# Patient Record
Sex: Male | Born: 1975 | Race: White | Hispanic: No | Smoking: Current every day smoker
Health system: Southern US, Community
[De-identification: ages and names within clinical notes are randomized; demographics above are authoritative.]

## PROBLEM LIST (undated history)

## (undated) DIAGNOSIS — R51 Headache: Secondary | ICD-10-CM

## (undated) DIAGNOSIS — K5900 Constipation, unspecified: Secondary | ICD-10-CM

## (undated) DIAGNOSIS — R519 Headache, unspecified: Secondary | ICD-10-CM

## (undated) DIAGNOSIS — F419 Anxiety disorder, unspecified: Secondary | ICD-10-CM

## (undated) DIAGNOSIS — M1990 Unspecified osteoarthritis, unspecified site: Secondary | ICD-10-CM

## (undated) DIAGNOSIS — K219 Gastro-esophageal reflux disease without esophagitis: Secondary | ICD-10-CM

## (undated) HISTORY — PX: BACK SURGERY: SHX140

## (undated) HISTORY — PX: CHOLECYSTECTOMY: SHX55

## (undated) HISTORY — PX: APPENDECTOMY: SHX54

---

## 1998-12-05 ENCOUNTER — Emergency Department (HOSPITAL_COMMUNITY): Admission: EM | Admit: 1998-12-05 | Discharge: 1998-12-05 | Payer: Self-pay | Admitting: Emergency Medicine

## 1998-12-05 ENCOUNTER — Encounter: Payer: Self-pay | Admitting: Emergency Medicine

## 2004-08-21 ENCOUNTER — Emergency Department (HOSPITAL_COMMUNITY): Admission: EM | Admit: 2004-08-21 | Discharge: 2004-08-21 | Payer: Self-pay | Admitting: Emergency Medicine

## 2005-03-17 ENCOUNTER — Emergency Department (HOSPITAL_COMMUNITY): Admission: EM | Admit: 2005-03-17 | Discharge: 2005-03-17 | Payer: Self-pay | Admitting: Emergency Medicine

## 2005-08-28 ENCOUNTER — Inpatient Hospital Stay (HOSPITAL_COMMUNITY): Admission: EM | Admit: 2005-08-28 | Discharge: 2005-08-29 | Payer: Self-pay | Admitting: Emergency Medicine

## 2005-08-28 ENCOUNTER — Encounter (INDEPENDENT_AMBULATORY_CARE_PROVIDER_SITE_OTHER): Payer: Self-pay | Admitting: *Deleted

## 2005-09-02 ENCOUNTER — Emergency Department (HOSPITAL_COMMUNITY): Admission: EM | Admit: 2005-09-02 | Discharge: 2005-09-02 | Payer: Self-pay | Admitting: Emergency Medicine

## 2006-04-11 ENCOUNTER — Emergency Department (HOSPITAL_COMMUNITY): Admission: EM | Admit: 2006-04-11 | Discharge: 2006-04-11 | Payer: Self-pay | Admitting: Emergency Medicine

## 2007-01-10 ENCOUNTER — Emergency Department (HOSPITAL_COMMUNITY): Admission: EM | Admit: 2007-01-10 | Discharge: 2007-01-10 | Payer: Self-pay | Admitting: Emergency Medicine

## 2007-09-19 ENCOUNTER — Emergency Department (HOSPITAL_COMMUNITY): Admission: EM | Admit: 2007-09-19 | Discharge: 2007-09-19 | Payer: Self-pay | Admitting: Emergency Medicine

## 2008-01-01 ENCOUNTER — Ambulatory Visit (HOSPITAL_COMMUNITY): Admission: RE | Admit: 2008-01-01 | Discharge: 2008-01-02 | Payer: Self-pay | Admitting: Neurosurgery

## 2008-08-02 ENCOUNTER — Emergency Department (HOSPITAL_COMMUNITY): Admission: EM | Admit: 2008-08-02 | Discharge: 2008-08-02 | Payer: Self-pay | Admitting: Emergency Medicine

## 2009-02-25 ENCOUNTER — Emergency Department (HOSPITAL_COMMUNITY): Admission: EM | Admit: 2009-02-25 | Discharge: 2009-02-25 | Payer: Self-pay | Admitting: Emergency Medicine

## 2009-05-05 ENCOUNTER — Emergency Department (HOSPITAL_COMMUNITY): Admission: EM | Admit: 2009-05-05 | Discharge: 2009-05-05 | Payer: Self-pay | Admitting: Emergency Medicine

## 2010-09-26 ENCOUNTER — Emergency Department (HOSPITAL_COMMUNITY)
Admission: EM | Admit: 2010-09-26 | Discharge: 2010-09-26 | Payer: Self-pay | Source: Home / Self Care | Admitting: Emergency Medicine

## 2011-02-14 NOTE — Op Note (Signed)
NAMEJUERGEN, Brett Stokes               ACCOUNT NO.:  [id number]   MEDICAL RECORD NO.:  [id number]          PATIENT TYPE:  OIB   LOCATION:  3534                         FACILITY:  [facility]   PHYSICIAN:  Coletta Memos, M.D.     DATE OF BIRTH:  1976/06/09   DATE OF PROCEDURE:  01/01/2008  DATE OF DISCHARGE:                               OPERATIVE REPORT   PREOPERATIVE DIAGNOSES:  1. Displaced disk, L4-5.  2. Displaced cyst, L5-S1, left.  3. Left L5 radiculopathy, left S1 radiculopathy.   POSTOPERATIVE DIAGNOSES:  1. Displaced disk, L4-5.  2. Displaced cyst, L5-S1, left.  3. Left L5 radiculopathy, left S1 radiculopathy.   PROCEDURE:  1. Left L5-S1 semi-hemilaminectomy and diskectomy with      microdissection.  2. Left L4-5 semi-hemilaminectomy and foraminotomy with      microdissection.   COMPLICATIONS:  None.   SURGEON:  Dr. Franky Macho.   ASSISTANT:  Dr. Danielle Dess.   INDICATIONS:  Mr. Cuccaro is a gentleman who presented with severe pain in  the left lower extremity, weakness in the left gastrocnemius, and on MRI  large herniated disk at L5-S1 centered to the left side, and a large  central disk at L4-5.  Both disks were degenerated.   Mr. Krumholz was taken to the operating room, intubated, placed under  general anesthetic without difficulty.  He is rolled prone onto a Wilson  frame, and all pressure points properly padded.  His back was prepped,  and he was draped in a sterile fashion.  I infiltrated 10 mL half  percent lidocaine 1:200,000 strength epinephrine at the beginning of the  case.  I opened the skin and took this down to the thoracolumbar fascia.  I exposed the lamina of L4-L5 and of S1.  I confirmed this with  intraoperative x-ray.  I then performed ascending hemilaminectomy of L4  and L5 using a high-speed drill and Kerrison punches.  I removed  ligamentum flavum and exposed the thecal sac between the L4 and L5  lamina.  I then performed a semi-hemilaminectomy at L5-S1.  I  brought  the microscope into the operative field and with microdissection  proceeded with my diskectomy at L5-S1.   With Dr. Verlee Rossetti assistance, I opened the disk space at L5-S1 and  removed disk material in a progressive fashion using pituitary rongeurs  and Epstein curettes.  I was able to fully decompress the left S1 nerve  root.  There was some disk left in the midline medial to the nerve root,  but at that point, I felt that I had done enough because the nerve root  was very well decompressed.   I then turned my attention back to the L4-5 level.  At that level, I  performed a foraminotomy.  However, Mr. Feagans is quite young, 35 years  of age, has had two marginally degenerated disks, and I did not feel  that removing the disk  at that level would be very helpful.  For that reason, I just performed  a foraminotomy with Dr. Verlee Rossetti assistance.  I then closed the wound in  layered fashion  using Vicryl sutures.  I used Dermabond to reapproximate  the skin edges.  Reapproximated the thoracolumbar fascia, subcutaneous  and subcuticular layers.           ______________________________  Coletta Memos, M.D.     KC/MEDQ  D:  01/01/2008  T:  01/02/2008  Job:  [id number]

## 2011-02-17 NOTE — Discharge Summary (Signed)
Brett Stokes, Brett Stokes               ACCOUNT NO.:  [id number]   MEDICAL RECORD NO.:  [id number]          PATIENT TYPE:  INP   LOCATION:  5707                         FACILITY:  [facility]   PHYSICIAN:  Vikki Ports, M.D.DATE OF BIRTH:  October 31, 1975   DATE OF ADMISSION:  08/27/2005  DATE OF DISCHARGE:  08/29/2005                                 DISCHARGE SUMMARY   DISCHARGE DIAGNOSES:  1.  Acute appendicitis, status post laparoscopic appendectomy on August 28, 2005.  2.  Polysubstance abuse.   [name] is a 35 year old male patient who has had approximately 12 hours  of right lower quadrant pain that has been waxing and waning.  The pain  persists and he presented to the emergency room, where white count was  10,000 and a CT showed only appendicitis.  The patient was taken emergently  to the operating room and underwent laparoscopic appendectomy by Dr.  Luan Pulling.  The patient tolerated the procedure well and was taken back to  his room in stable condition.  He was doing well the following morning and  was ready for discharge to home.  He was discharged to home in stable and  improved condition.   His discharge instructions included follow-up with Dr. Luan Pulling in three  weeks.  He was given a prescription for Vicodin as needed for pain, no  driving for one week, and he may shower on August 30, 2005.      [name], P.A.    ______________________________  Vikki Ports, M.D.    LB/MEDQ  D:  11/15/2005  T:  11/15/2005  Job:  [id number]

## 2011-02-17 NOTE — H&P (Signed)
NAMEMANASSEH, PITTSLEY               ACCOUNT NO.:  [id number]   MEDICAL RECORD NO.:  [id number]          PATIENT TYPE:  INP   LOCATION:  1824                         FACILITY:  [facility]   PHYSICIAN:  [name], P.A.       DATE OF BIRTH:  1975/10/10   DATE OF ADMISSION:  08/27/2005  DATE OF DISCHARGE:                                HISTORY & PHYSICAL   CHIEF COMPLAINT:  Right lower quadrant pain.   Brett Stokes presented to the emergency room last evening with less than 12  hours of right lower quadrant pain.  Pain was initially severe and then  became more like a waxing and waning pain.  Eventually the pain became so  severe he presented to the emergency room.  His white count is 10,000.  His  CT shows an early small appendolith with no definite appendicitis, though he  is definitely tender over McBurney's point.   ALLERGIES:  None.   MEDICATIONS:  None.   PAST MEDICAL HISTORY:  No significant past medical history.   SOCIAL HISTORY:  He does smoke.  He does drink at least a six-pack per week  and he used marijuana last night.   FAMILY HISTORY:  Mom is alive and well.  Dad is alive, but has had colon  cancer.   REVIEW OF SYSTEMS:  No chest pain.  No shortness of breath.  Review of  systems otherwise negative.   PHYSICAL EXAMINATION:  VITAL SIGNS:  Temperature 97.5, pulse 96,  respirations 19, blood pressure 125/63.  GENERAL:  He appears to be in mild distress.  HEENT:  Grossly normal.  No subclavian bruits.  Sclerae clear.  Conjunctivae  normal.  Nares without drainage.  CHEST:  Clear to auscultation bilaterally.  No wheezing or rhonchi.  ABDOMEN:  Right lower quadrant pin point pain and positive McBurney's.  EXTREMITIES:  No edema.  SKIN:  Warm and dry.   ASSESSMENT/PLAN:  Early appendicitis.  Discussed this plan further with Dr.  Luan Pulling and our plans are tentative OR later today.      [name], P.A.     LB/MEDQ  D:  08/28/2005  T:  08/28/2005  Job:  [id number]

## 2011-02-17 NOTE — Op Note (Signed)
[name], Brett Stokes               ACCOUNT NO.:  [id number]   MEDICAL RECORD NO.:  [id number]          PATIENT TYPE:  INP   LOCATION:  5707                         FACILITY:  [facility]   PHYSICIAN:  Vikki Ports, MDDATE OF BIRTH:  [birth year]   DATE OF PROCEDURE:  08/28/2005  DATE OF DISCHARGE:                                 OPERATIVE REPORT   PREOPERATIVE DIAGNOSIS:  Acute appendicitis.   POSTOPERATIVE DIAGNOSIS:  Acute appendicitis.   PROCEDURE:  Laparoscopic appendectomy.   SURGEON:  Vikki Ports, M.D.   ANESTHESIA:  General anesthesia.   DESCRIPTION OF PROCEDURE:  Patient was taken to the operating room and  placed in the supine position.  After adequate general anesthesia was used  using endotracheal tube, the abdomen was prepped and draped in normal  sterile fashion.  Using a left upper quadrant incision, a 12 mm Optiview  trocar was inserted into the abdomen.  Pneumoperitoneum was obtained.  Additional 12 mm and 5 mm trocars were placed in the left abdomen.  The  appendix was identified in the right lower quadrant.  Mesoappendix was taken  down using Harmonic scalpel.  There was some mild inflammation of the  appendix but no evidence of perforation and only mild inflammation. The base  of the appendix was then transected using a __________ GIA stapling device.  It was placed in an EndoCatch bag and removed through the 12 mm site.  Right  lower quadrant was then copiously irrigated.  The stapled edge was  inspected.  No evidence of bleeding was noted.  Pneumoperitoneum was  released.  Incisions were closed with subcuticular 4-0 Monocryl.  Steri-  Strips and sterile dressings were applied.  The patient tolerated the  procedure well and went to PACU in good condition.      Vikki Ports, MD  Electronically Signed     KRH/MEDQ  D:  08/28/2005  T:  08/28/2005  Job:  [id number]

## 2011-06-27 LAB — CBC
HCT: 44.1
Hemoglobin: 15.5
MCHC: 35.1
MCV: 94.4
Platelets: 172
RBC: 4.67
RDW: 14
WBC: 7.7

## 2011-07-07 LAB — RAPID STREP SCREEN (MED CTR MEBANE ONLY): Streptococcus, Group A Screen (Direct): POSITIVE — AB

## 2011-07-08 ENCOUNTER — Emergency Department (HOSPITAL_COMMUNITY): Payer: Self-pay

## 2011-07-08 ENCOUNTER — Emergency Department (HOSPITAL_COMMUNITY)
Admission: EM | Admit: 2011-07-08 | Discharge: 2011-07-08 | Disposition: A | Discharge status: Home / Self Care | Payer: Self-pay | Attending: Emergency Medicine | Admitting: Emergency Medicine

## 2011-07-08 DIAGNOSIS — S139XXA Sprain of joints and ligaments of unspecified parts of neck, initial encounter: Secondary | ICD-10-CM | POA: Insufficient documentation

## 2011-07-08 DIAGNOSIS — S0180XA Unspecified open wound of other part of head, initial encounter: Secondary | ICD-10-CM | POA: Insufficient documentation

## 2011-07-08 DIAGNOSIS — S0083XA Contusion of other part of head, initial encounter: Secondary | ICD-10-CM | POA: Insufficient documentation

## 2011-07-08 DIAGNOSIS — S0003XA Contusion of scalp, initial encounter: Secondary | ICD-10-CM | POA: Insufficient documentation

## 2011-07-08 DIAGNOSIS — Y9229 Other specified public building as the place of occurrence of the external cause: Secondary | ICD-10-CM | POA: Insufficient documentation

## 2011-07-08 DIAGNOSIS — F1010 Alcohol abuse, uncomplicated: Secondary | ICD-10-CM | POA: Insufficient documentation

## 2011-07-08 DIAGNOSIS — M542 Cervicalgia: Secondary | ICD-10-CM | POA: Insufficient documentation

## 2011-07-08 DIAGNOSIS — S0990XA Unspecified injury of head, initial encounter: Secondary | ICD-10-CM | POA: Insufficient documentation

## 2011-07-08 LAB — CBC
MCH: 33.8 pg (ref 26.0–34.0)
MCHC: 36.3 g/dL — ABNORMAL HIGH (ref 30.0–36.0)
MCV: 93.1 fL (ref 78.0–100.0)
Platelets: 218 10*3/uL (ref 150–400)

## 2011-07-08 LAB — BASIC METABOLIC PANEL
CO2: 24 mEq/L (ref 19–32)
Calcium: 8.8 mg/dL (ref 8.4–10.5)
GFR calc Af Amer: >90 mL/min (ref >90)

## 2011-07-08 LAB — ETHANOL: Alcohol, Ethyl (B): 180 mg/dL — ABNORMAL HIGH (ref 0–11)

## 2011-10-05 ENCOUNTER — Emergency Department (HOSPITAL_COMMUNITY)
Admission: EM | Admit: 2011-10-05 | Discharge: 2011-10-05 | Disposition: A | Discharge status: Home / Self Care | Payer: Self-pay | Attending: Emergency Medicine | Admitting: Emergency Medicine

## 2011-10-05 ENCOUNTER — Emergency Department (HOSPITAL_COMMUNITY): Payer: Self-pay

## 2011-10-05 DIAGNOSIS — M5430 Sciatica, unspecified side: Secondary | ICD-10-CM | POA: Insufficient documentation

## 2011-10-05 DIAGNOSIS — M79609 Pain in unspecified limb: Secondary | ICD-10-CM | POA: Insufficient documentation

## 2011-10-05 DIAGNOSIS — M5380 Other specified dorsopathies, site unspecified: Secondary | ICD-10-CM | POA: Insufficient documentation

## 2011-10-05 DIAGNOSIS — M5412 Radiculopathy, cervical region: Secondary | ICD-10-CM | POA: Insufficient documentation

## 2011-10-05 DIAGNOSIS — M5432 Sciatica, left side: Secondary | ICD-10-CM

## 2011-10-05 DIAGNOSIS — M545 Low back pain, unspecified: Secondary | ICD-10-CM | POA: Insufficient documentation

## 2011-10-05 DIAGNOSIS — M542 Cervicalgia: Secondary | ICD-10-CM | POA: Insufficient documentation

## 2011-10-05 DIAGNOSIS — R209 Unspecified disturbances of skin sensation: Secondary | ICD-10-CM | POA: Insufficient documentation

## 2011-10-05 MED ORDER — METHOCARBAMOL 500 MG PO TABS: 500.0000 mg | ORAL_TABLET | Freq: Two times a day (BID) | ORAL | Status: AC

## 2011-10-05 MED ORDER — METHOCARBAMOL 500 MG PO TABS
500.0000 mg | ORAL_TABLET | ORAL | Status: AC
  Administered 2011-10-05: 500 mg via ORAL
  Filled 2011-10-05: qty 1

## 2011-10-05 MED ORDER — DEXAMETHASONE SODIUM PHOSPHATE 10 MG/ML IJ SOLN
10.0000 mg | Freq: Once | INTRAMUSCULAR | Status: AC
  Administered 2011-10-05: 10 mg via INTRAMUSCULAR
  Filled 2011-10-05: qty 1

## 2011-10-05 MED ORDER — NAPROXEN 500 MG PO TABS: 500.0000 mg | ORAL_TABLET | Freq: Two times a day (BID) | ORAL | Status: AC

## 2011-10-05 MED ORDER — HYDROCODONE-ACETAMINOPHEN 5-325 MG PO TABS: 1.0000 | ORAL_TABLET | Freq: Four times a day (QID) | ORAL | Status: AC | PRN

## 2011-10-05 MED ORDER — KETOROLAC TROMETHAMINE 60 MG/2ML IM SOLN
60.0000 mg | Freq: Once | INTRAMUSCULAR | Status: AC
  Administered 2011-10-05: 60 mg via INTRAMUSCULAR
  Filled 2011-10-05: qty 2

## 2011-10-05 NOTE — ED Provider Notes (Deleted)
I saw and evaluated the patient, reviewed the resident's note and I agree with the findings and plan.  Patient does not appear to be any acute distress. She states she has fibromyalgia and this has not had urinary for her. Patient states her anxiety has been giving her trouble and her primary reason for coming to the ED he was to get some assistance with that. At this time there does not appear to be any evidence of acute emergency medical condition. Patient be discharged home and given referrals.  Celene Kras, MD 10/05/11 1009

## 2011-10-05 NOTE — ED Provider Notes (Signed)
Medical screening examination/treatment/procedure(s) were performed by non-physician practitioner and as supervising physician I was immediately available for consultation/collaboration.   Glynn Octave, MD 10/05/11 [phone]
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
## 2011-10-05 NOTE — ED Notes (Signed)
Lower back pain and posterior neck pain since October. Denies any injuries.  Pt. Has been treating the pain with [facility] and [facility]

## 2012-04-16 ENCOUNTER — Emergency Department (INDEPENDENT_AMBULATORY_CARE_PROVIDER_SITE_OTHER)
Admission: EM | Admit: 2012-04-16 | Discharge: 2012-04-16 | Disposition: A | Discharge status: Home / Self Care | Payer: Self-pay | Source: Home / Self Care | Attending: Emergency Medicine | Admitting: Emergency Medicine

## 2012-04-16 ENCOUNTER — Encounter (HOSPITAL_COMMUNITY): Payer: Self-pay

## 2012-04-16 DIAGNOSIS — G43909 Migraine, unspecified, not intractable, without status migrainosus: Secondary | ICD-10-CM

## 2012-04-16 MED ORDER — PREDNISONE 10 MG PO TABS: ORAL_TABLET | ORAL | Status: DC

## 2012-04-16 MED ORDER — PROPRANOLOL HCL ER 60 MG PO CP24: 60.0000 mg | ORAL_CAPSULE | Freq: Every day | ORAL | Status: DC

## 2012-04-16 MED ORDER — DIPHENHYDRAMINE HCL 50 MG/ML IJ SOLN
INTRAMUSCULAR | Status: AC
  Filled 2012-04-16: qty 1

## 2012-04-16 MED ORDER — ZOLMITRIPTAN 5 MG PO TABS: 5.0000 mg | ORAL_TABLET | ORAL | Status: DC | PRN

## 2012-04-16 MED ORDER — DIPHENHYDRAMINE HCL 50 MG/ML IJ SOLN
25.0000 mg | Freq: Once | INTRAMUSCULAR | Status: AC
  Administered 2012-04-16: 25 mg via INTRAMUSCULAR

## 2012-04-16 MED ORDER — KETOROLAC TROMETHAMINE 60 MG/2ML IM SOLN
INTRAMUSCULAR | Status: AC
  Filled 2012-04-16: qty 2

## 2012-04-16 MED ORDER — DEXAMETHASONE SODIUM PHOSPHATE 10 MG/ML IJ SOLN
INTRAMUSCULAR | Status: AC
  Filled 2012-04-16: qty 1

## 2012-04-16 MED ORDER — KETOROLAC TROMETHAMINE 60 MG/2ML IM SOLN
60.0000 mg | Freq: Once | INTRAMUSCULAR | Status: AC
Start: 2012-04-16 — End: 2012-04-16
  Administered 2012-04-16: 60 mg via INTRAMUSCULAR

## 2012-04-16 MED ORDER — DEXAMETHASONE SODIUM PHOSPHATE 10 MG/ML IJ SOLN
10.0000 mg | Freq: Once | INTRAMUSCULAR | Status: AC
  Administered 2012-04-16: 10 mg via INTRAMUSCULAR

## 2012-04-16 NOTE — ED Provider Notes (Signed)
Chief Complaint  Patient presents with  . Headache    History of Present Illness:   Brett Stokes is a 36 year old male who has had a many year history of migraine headaches. They seem to have gotten worse over the past 2 months and is not sure why. For the past week they've been occurring almost every night. He tried Topamax 50 mg 3 times a day but this didn't seem to help for prevention and tried sumatriptan but this didn't help for treatment. He's also tried ibuprofen but this hasn't helped much. He describes a left-sided, severe, throbbing headache associated with nausea, vomiting, photophobia, and phonophobia. Activity makes the pain worse and rest makes it better. He also has noted some associated vertigo and a feeling of being off balance when he walks. He denies any visual symptoms, numbness, tingling, or focal muscular weakness. He does not know of any specific migraine triggers. He's never been to see a physician about the migraines before, never had any kind of a headache workup, and never taken any prescription medication.  Review of Systems:  Other than noted above, the patient denies any of the following symptoms: Systemic:  No fever, chills, fatigue, photophobia, stiff neck. Eye:  No redness, eye pain, discharge, blurred vision, or diplopia. ENT:  No nasal congestion, rhinorrhea, sinus pressure or pain, sneezing, earache, or sore throat.  No jaw claudication. Neuro:  No paresthesias, loss of consciousness, seizure activity, muscle weakness, trouble with coordination or gait, trouble speaking or swallowing. Psych:  No depression, anxiety or trouble sleeping.  PMFSH:  Past medical history, family history, social history, meds, and allergies were reviewed.  Physical Exam:   Vital signs:  BP 113/74  Pulse 56  Temp 98 F (36.7 C) (Oral)  Resp 18  SpO2 100% General:  Alert and oriented.  In no distress. Eye:  Lids and conjunctivas normal.  PERRL,  Full EOMs.  Fundi benign with normal discs  and vessels. ENT:  TMs and canals clear.  Nasal mucosa was normal and uncongested without any drainage. No intra oral lesions, pharynx clear, mucous membranes moist, dentition normal. He has pain to palpation over the entire left hemicranium, extending down to the neck. Neck:  Supple, full ROM, no tenderness to palpation.  No adenopathy or mass. Neuro:  Alert and orented times 3.  Speech was clear, fluent, and appropriate.  Cranial nerves intact. No pronator drift, muscle strength normal. Finger to nose normal.  DTRs were 2+ and symmetrical.Station and gait were normal.  Romberg's sign was normal.  Able to perform tandem gait well. Psych:  Normal affect.  Medications given in [facility]:  He was given dexamethasone 10 mg IM, Toradol 60 mg IM, and Benadryl 25 mg IM and tolerated these all well without any immediate side effects.  Assessment:  The encounter diagnosis was Migraine headache.  Plan:   1.  The following meds were prescribed:   New Prescriptions   PREDNISONE (DELTASONE) 10 MG TABLET    Take 4 tabs daily for 4 days, 3 tabs daily for 4 days, 2 tabs daily for 4 days, then 1 tab daily for 4 days.   PROPRANOLOL ER (INDERAL LA) 60 MG 24 HR CAPSULE    Take 1 capsule (60 mg total) by mouth daily.   ZOLMITRIPTAN (ZOMIG) 5 MG TABLET    Take 1 tablet (5 mg total) by mouth as needed for migraine.   2.  The patient was instructed in symptomatic care and handouts were given. 3.  The patient  was told to return if becoming worse in any way, if no better in 3 or 4 days, and given some red flag symptoms that would indicate earlier return.  Follow up:  The patient was told to follow up with Dr. Karenann Cai in one to 2 weeks.     Reuben Likes, MD 04/16/12 [phone]
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
## 2014-06-14 ENCOUNTER — Emergency Department (HOSPITAL_COMMUNITY): Payer: Self-pay

## 2014-06-14 ENCOUNTER — Encounter (HOSPITAL_COMMUNITY): Payer: Self-pay | Admitting: Emergency Medicine

## 2014-06-14 ENCOUNTER — Emergency Department (HOSPITAL_COMMUNITY)
Admission: EM | Admit: 2014-06-14 | Discharge: 2014-06-14 | Disposition: A | Discharge status: Home / Self Care | Payer: Self-pay | Attending: Emergency Medicine | Admitting: Emergency Medicine

## 2014-06-14 DIAGNOSIS — S4980XA Other specified injuries of shoulder and upper arm, unspecified arm, initial encounter: Secondary | ICD-10-CM | POA: Insufficient documentation

## 2014-06-14 DIAGNOSIS — Z79899 Other long term (current) drug therapy: Secondary | ICD-10-CM | POA: Insufficient documentation

## 2014-06-14 DIAGNOSIS — S46909A Unspecified injury of unspecified muscle, fascia and tendon at shoulder and upper arm level, unspecified arm, initial encounter: Secondary | ICD-10-CM | POA: Insufficient documentation

## 2014-06-14 DIAGNOSIS — Y9229 Other specified public building as the place of occurrence of the external cause: Secondary | ICD-10-CM | POA: Insufficient documentation

## 2014-06-14 DIAGNOSIS — M542 Cervicalgia: Secondary | ICD-10-CM

## 2014-06-14 DIAGNOSIS — F17200 Nicotine dependence, unspecified, uncomplicated: Secondary | ICD-10-CM | POA: Insufficient documentation

## 2014-06-14 DIAGNOSIS — S40011A Contusion of right shoulder, initial encounter: Secondary | ICD-10-CM

## 2014-06-14 DIAGNOSIS — S40019A Contusion of unspecified shoulder, initial encounter: Secondary | ICD-10-CM | POA: Insufficient documentation

## 2014-06-14 DIAGNOSIS — S199XXA Unspecified injury of neck, initial encounter: Secondary | ICD-10-CM

## 2014-06-14 DIAGNOSIS — S0993XA Unspecified injury of face, initial encounter: Secondary | ICD-10-CM | POA: Insufficient documentation

## 2014-06-14 DIAGNOSIS — W010XXA Fall on same level from slipping, tripping and stumbling without subsequent striking against object, initial encounter: Secondary | ICD-10-CM | POA: Insufficient documentation

## 2014-06-14 DIAGNOSIS — Y9389 Activity, other specified: Secondary | ICD-10-CM | POA: Insufficient documentation

## 2014-06-14 MED ORDER — HYDROCODONE-ACETAMINOPHEN 5-325 MG PO TABS
2.0000 | ORAL_TABLET | Freq: Once | ORAL | Status: AC
  Administered 2014-06-14: 2 via ORAL
  Filled 2014-06-14: qty 2

## 2014-06-14 MED ORDER — HYDROCODONE-ACETAMINOPHEN 5-325 MG PO TABS
1.0000 | ORAL_TABLET | Freq: Four times a day (QID) | ORAL | Status: DC | PRN
Start: 2014-06-14 — End: 2015-01-01

## 2014-06-14 MED ORDER — IBUPROFEN 600 MG PO TABS: 600.0000 mg | ORAL_TABLET | Freq: Three times a day (TID) | ORAL | Status: DC | PRN

## 2014-06-14 NOTE — ED Provider Notes (Addendum)
CSN: [id number]     Arrival date & time 06/14/14  7829 History   First MD Initiated Contact with Patient 06/14/14 0830     Chief Complaint  Patient presents with  . Shoulder Pain     (Consider location/radiation/quality/duration/timing/severity/associated sxs/prior Treatment) Patient is a 38 y.o. male presenting with shoulder pain. The history is provided by the patient.  Shoulder Pain Pertinent negatives include no chest pain, no abdominal pain, no headaches and no shortness of breath.  pt c/o slip and fall on wet floor at restaurant this morning. Landed on right side/shoulder. C/o right clavicle and shoulder pain, as well as neck pain. Pain constant, dull, mod-severe, non radiating. Worse w palpation and movement. No radicular pain. No extremity numbness/weakness. Denies head injury or loc. No headache. No back pain. No cp or sob. No abd pain or nv. No lower extremity pain or injury. Skin intact.      History reviewed. No pertinent past medical history. Past Surgical History  Procedure Laterality Date  . Back surgery    . Cholecystectomy    . Appendectomy     Family History  Problem Relation Age of Onset  . Cancer Father    History  Substance Use Topics  . Smoking status: Current Every Day Smoker  . Smokeless tobacco: Not on file  . Alcohol Use: Yes    Review of Systems  Constitutional: Negative for fever and chills.  HENT: Negative for sore throat.   Eyes: Negative for pain and visual disturbance.  Respiratory: Negative for shortness of breath.   Cardiovascular: Negative for chest pain.  Gastrointestinal: Negative for vomiting and abdominal pain.  Genitourinary: Negative for flank pain.  Musculoskeletal: Positive for neck pain. Negative for back pain.  Skin: Negative for rash.  Neurological: Negative for weakness, numbness and headaches.  Hematological: Does not bruise/bleed easily.  Psychiatric/Behavioral: Negative for confusion.      Allergies  Review of  patient's allergies indicates no known allergies.  Home Medications   Prior to Admission medications   Medication Sig Start Date End Date Taking? Authorizing Provider  clonazePAM (KLONOPIN) 1 MG tablet Take 1 mg by mouth once.   Yes Historical Provider, MD   BP 127/77  Pulse 84  Temp(Src) 97.9 F (36.6 C) (Oral)  Resp 18  SpO2 96% Physical Exam  Nursing note and vitals reviewed. Constitutional: He is oriented to person, place, and time. He appears well-developed and well-nourished. No distress.  HENT:  Head: Atraumatic.  Mouth/Throat: Oropharynx is clear and moist.  Eyes: Conjunctivae are normal. Pupils are equal, round, and reactive to light. No scleral icterus.  Neck: Normal range of motion. Neck supple. No tracheal deviation present.  Cardiovascular: Normal rate, regular rhythm, normal heart sounds and intact distal pulses.   Pulmonary/Chest: Effort normal and breath sounds normal. No accessory muscle usage. No respiratory distress. He exhibits no tenderness.  Abdominal: Soft. Bowel sounds are normal. He exhibits no distension. There is no tenderness.  Musculoskeletal: Normal range of motion.  Mid cervical tenderness, otherwise, CTLS spine, non tender, aligned, no step off. Tenderness right clavicle and right shoulder, otherwise, good rom bil ext without other pain or focal bony tenderness.   Neurological: He is alert and oriented to person, place, and time.  Motor intact bil. stre 5/5. sens intact.   Skin: Skin is warm and dry. He is not diaphoretic.  Psychiatric: He has a normal mood and affect.    ED Course  Procedures (including critical care time) Labs Review  Dg Cervical Spine Complete  06/14/2014   CLINICAL DATA:  Fall with neck pain.  EXAM: CERVICAL SPINE  4+ VIEWS  COMPARISON:  10/05/2011 radiographs  FINDINGS: Reversal of the normal cervical lordosis is noted.  There is no evidence of acute fracture, subluxation or prevertebral soft tissue swelling.  No focal bony  lesions are identified. The disc spaces are maintained.  No bony foraminal narrowing identified.  IMPRESSION: Reversal of the normal cervical lordosis without fracture, subluxation or prevertebral soft tissue swelling.   Electronically Signed   By: Laveda Abbe M.D.   On: 06/14/2014 09:06   Dg Clavicle Right  06/14/2014   CLINICAL DATA:  38 year old male with right clavicle pain following fall.  EXAM: RIGHT CLAVICLE - 2+ VIEWS  COMPARISON:  None.  FINDINGS: There is no evidence of fracture or other focal bone lesions. Soft tissues are unremarkable.  IMPRESSION: Negative.   Electronically Signed   By: Laveda Abbe M.D.   On: 06/14/2014 09:04   Dg Shoulder Right  06/14/2014   CLINICAL DATA:  Fall with right shoulder injury and pain.  EXAM: RIGHT SHOULDER - 2+ VIEW  COMPARISON:  None.  FINDINGS: There is no evidence of fracture or dislocation. There is no evidence of arthropathy or other focal bone abnormality. Soft tissues are unremarkable.  IMPRESSION: Negative.   Electronically Signed   By: Laveda Abbe M.D.   On: 06/14/2014 09:09     MDM   Xrays.  Reviewed nursing notes and prior charts for additional history.   Pt arrived via ems, no meds for pain pta.  vicodin po. Motrin po.   xrays neg for acute fx. Recheck spine, no focal midline bony tenderness. Pt appears comfortable, talking on cell phone.   When discussing xray results with patient and specifically that no acute fracture was found, that his pain likely reflects muscular sprain/strain, spasm, nerve impingement, or other soft tissue injury, and that we would be discharging home w rx for pain and pcp follow up, the pt immediately becomes inappropriate, agitated, verbally abusive, stating '[place] and  suck..this place is the most f'd up, terrible place.....Marland KitchenI'm going elsewhere, and abruptly leaves room/ED.      Suzi Roots, MD 06/14/14 [id number]  Suzi Roots, MD 06/14/14 [phone]

## 2014-06-14 NOTE — ED Notes (Signed)
Pt in Radiology 

## 2014-06-14 NOTE — ED Notes (Signed)
Per ems pt was at [employer], reports he slipped on water in bathroom. Fell onto right sided, landing on right shoulder. Right shoulder and right sided neck pain 10/10. No neuro deficits, no LOC, denies hitting head. Pt in c collar. Pain 10/10.

## 2014-06-14 NOTE — Discharge Instructions (Signed)
Take motrin as need for pain. You may also take hydrocodone as need for pain. No driving when taking hydrocodone. Also, do not take tylenol or acetaminophen containing medication when taking hydrocodone. Follow up with primary care doctor in 1 week if symptoms fail to improve/resolve. Return to ER if worse, new symptoms, numbness/weakness, intractable pain, other concern.  You were given pain medication in the ER - no driving for the next 4 hours.     Cervical Sprain A cervical sprain is an injury in the neck in which the strong, fibrous tissues (ligaments) that connect your neck bones stretch or tear. Cervical sprains can range from mild to severe. Severe cervical sprains can cause the neck vertebrae to be unstable. This can lead to damage of the spinal cord and can result in serious nervous system problems. The amount of time it takes for a cervical sprain to get better depends on the cause and extent of the injury. Most cervical sprains heal in 1 to 3 weeks. CAUSES  Severe cervical sprains may be caused by:   Contact sport injuries (such as from football, rugby, wrestling, hockey, auto racing, gymnastics, diving, martial arts, or boxing).   Motor vehicle collisions.   Whiplash injuries. This is an injury from a sudden forward and backward whipping movement of the head and neck.  Falls.  Mild cervical sprains may be caused by:   Being in an awkward position, such as while cradling a telephone between your ear and shoulder.   Sitting in a chair that does not offer proper support.   Working at a poorly Marketing executive station.   Looking up or down for long periods of time.  SYMPTOMS   Pain, soreness, stiffness, or a burning sensation in the front, back, or sides of the neck. This discomfort may develop immediately after the injury or slowly, 24 hours or more after the injury.   Pain or tenderness directly in the middle of the back of the neck.   Shoulder or upper back  pain.   Limited ability to move the neck.   Headache.   Dizziness.   Weakness, numbness, or tingling in the hands or arms.   Muscle spasms.   Difficulty swallowing or chewing.   Tenderness and swelling of the neck.  DIAGNOSIS  Most of the time your health care provider can diagnose a cervical sprain by taking your history and doing a physical exam. Your health care provider will ask about previous neck injuries and any known neck problems, such as arthritis in the neck. X-rays may be taken to find out if there are any other problems, such as with the bones of the neck. Other tests, such as a CT scan or MRI, may also be needed.  TREATMENT  Treatment depends on the severity of the cervical sprain. Mild sprains can be treated with rest, keeping the neck in place (immobilization), and pain medicines. Severe cervical sprains are immediately immobilized. Further treatment is done to help with pain, muscle spasms, and other symptoms and may include:  Medicines, such as pain relievers, numbing medicines, or muscle relaxants.   Physical therapy. This may involve stretching exercises, strengthening exercises, and posture training. Exercises and improved posture can help stabilize the neck, strengthen muscles, and help stop symptoms from returning.  HOME CARE INSTRUCTIONS   Put ice on the injured area.   Put ice in a plastic bag.   Place a towel between your skin and the bag.   Leave the ice on for  15-20 minutes, 3-4 times a day.   If your injury was severe, you may have been given a cervical collar to wear. A cervical collar is a two-piece collar designed to keep your neck from moving while it heals.  Do not remove the collar unless instructed by your health care provider.  If you have long hair, keep it outside of the collar.  Ask your health care provider before making any adjustments to your collar. Minor adjustments may be required over time to improve comfort and  reduce pressure on your chin or on the back of your head.  Ifyou are allowed to remove the collar for cleaning or bathing, follow your health care provider's instructions on how to do so safely.  Keep your collar clean by wiping it with mild soap and water and drying it completely. If the collar you have been given includes removable pads, remove them every 1-2 days and hand wash them with soap and water. Allow them to air dry. They should be completely dry before you wear them in the collar.  If you are allowed to remove the collar for cleaning and bathing, wash and dry the skin of your neck. Check your skin for irritation or sores. If you see any, tell your health care provider.  Do not drive while wearing the collar.   Only take over-the-counter or prescription medicines for pain, discomfort, or fever as directed by your health care provider.   Keep all follow-up appointments as directed by your health care provider.   Keep all physical therapy appointments as directed by your health care provider.   Make any needed adjustments to your workstation to promote good posture.   Avoid positions and activities that make your symptoms worse.   Warm up and stretch before being active to help prevent problems.  SEEK MEDICAL CARE IF:   Your pain is not controlled with medicine.   You are unable to decrease your pain medicine over time as planned.   Your activity level is not improving as expected.  SEEK IMMEDIATE MEDICAL CARE IF:   You develop any bleeding.  You develop stomach upset.  You have signs of an allergic reaction to your medicine.   Your symptoms get worse.   You develop new, unexplained symptoms.   You have numbness, tingling, weakness, or paralysis in any part of your body.  MAKE SURE YOU:   Understand these instructions.  Will watch your condition.  Will get help right away if you are not doing well or get worse. Document Released: 07/16/2007  Document Revised: 09/23/2013 Document Reviewed: 03/26/2013 [facility] Patient Information 2015 [place], [place]. This information is not intended to replace advice given to you by your health care provider. Make sure you discuss any questions you have with your health care provider.    Shoulder Pain The shoulder is the joint that connects your arms to your body. The bones that form the shoulder joint include the upper arm bone (humerus), the shoulder blade (scapula), and the collarbone (clavicle). The top of the humerus is shaped like a ball and fits into a rather flat socket on the scapula (glenoid cavity). A combination of muscles and strong, fibrous tissues that connect muscles to bones (tendons) support your shoulder joint and hold the ball in the socket. Small, fluid-filled sacs (bursae) are located in different areas of the joint. They act as cushions between the bones and the overlying soft tissues and help reduce friction between the gliding tendons and the bone  as you move your arm. Your shoulder joint allows a wide range of motion in your arm. This range of motion allows you to do things like scratch your back or throw a ball. However, this range of motion also makes your shoulder more prone to pain from overuse and injury. Causes of shoulder pain can originate from both injury and overuse and usually can be grouped in the following four categories:  Redness, swelling, and pain (inflammation) of the tendon (tendinitis) or the bursae (bursitis).  Instability, such as a dislocation of the joint.  Inflammation of the joint (arthritis).  Broken bone (fracture). HOME CARE INSTRUCTIONS   Apply ice to the sore area.  Put ice in a plastic bag.  Place a towel between your skin and the bag.  Leave the ice on for 15-20 minutes, 3-4 times per day for the first 2 days, or as directed by your health care provider.  Stop using cold packs if they do not help with the pain.  If you have a shoulder  sling or immobilizer, wear it as long as your caregiver instructs. Only remove it to shower or bathe. Move your arm as little as possible, but keep your hand moving to prevent swelling.  Squeeze a soft ball or foam pad as much as possible to help prevent swelling.  Only take over-the-counter or prescription medicines for pain, discomfort, or fever as directed by your caregiver. SEEK MEDICAL CARE IF:   Your shoulder pain increases, or new pain develops in your arm, hand, or fingers.  Your hand or fingers become cold and numb.  Your pain is not relieved with medicines. SEEK IMMEDIATE MEDICAL CARE IF:   Your arm, hand, or fingers are numb or tingling.  Your arm, hand, or fingers are significantly swollen or turn white or blue. MAKE SURE YOU:   Understand these instructions.  Will watch your condition.  Will get help right away if you are not doing well or get worse. Document Released: 06/28/2005 Document Revised: 02/02/2014 Document Reviewed: 09/02/2011 [facility] Patient Information 2015 [place], [place]. This information is not intended to replace advice given to you by your health care provider. Make sure you discuss any questions you have with your health care provider.

## 2014-06-14 NOTE — ED Notes (Signed)
Bed: MV78 Expected date: 06/14/14 Expected time: 8:08 AM Means of arrival: Ambulance Comments: Fall, shoulder injury

## 2014-06-14 NOTE — ED Notes (Signed)
Pt lets NT Verlon Au he is leaving and not waiting any more. Pt left facility. Pt ambulatory upon leaving. Pt left without dc papers

## 2014-06-15 ENCOUNTER — Encounter (HOSPITAL_COMMUNITY): Payer: Self-pay | Admitting: Emergency Medicine

## 2014-06-15 ENCOUNTER — Emergency Department (HOSPITAL_COMMUNITY)
Admission: EM | Admit: 2014-06-15 | Discharge: 2014-06-15 | Disposition: A | Discharge status: Home / Self Care | Payer: Self-pay | Attending: Emergency Medicine | Admitting: Emergency Medicine

## 2014-06-15 DIAGNOSIS — M25519 Pain in unspecified shoulder: Secondary | ICD-10-CM | POA: Insufficient documentation

## 2014-06-15 DIAGNOSIS — F17200 Nicotine dependence, unspecified, uncomplicated: Secondary | ICD-10-CM | POA: Insufficient documentation

## 2014-06-15 DIAGNOSIS — M542 Cervicalgia: Secondary | ICD-10-CM | POA: Insufficient documentation

## 2014-06-15 DIAGNOSIS — M25511 Pain in right shoulder: Secondary | ICD-10-CM

## 2014-06-15 DIAGNOSIS — W19XXXD Unspecified fall, subsequent encounter: Secondary | ICD-10-CM

## 2014-06-15 NOTE — Discharge Instructions (Signed)
Continue taking tramadol and ibuprofen for pain.  Musculoskeletal Pain Musculoskeletal pain is muscle and boney aches and pains. These pains can occur in any part of the body. Your caregiver may treat you without knowing the cause of the pain. They may treat you if blood or urine tests, X-rays, and other tests were normal.  CAUSES There is often not a definite cause or reason for these pains. These pains may be caused by a type of germ (virus). The discomfort may also come from overuse. Overuse includes working out too hard when your body is not fit. Boney aches also come from weather changes. Bone is sensitive to atmospheric pressure changes. HOME CARE INSTRUCTIONS   Ask when your test results will be ready. Make sure you get your test results.  Only take over-the-counter or prescription medicines for pain, discomfort, or fever as directed by your caregiver. If you were given medications for your condition, do not drive, operate machinery or power tools, or sign legal documents for 24 hours. Do not drink alcohol. Do not take sleeping pills or other medications that may interfere with treatment.  Continue all activities unless the activities cause more pain. When the pain lessens, slowly resume normal activities. Gradually increase the intensity and duration of the activities or exercise.  During periods of severe pain, bed rest may be helpful. Lay or sit in any position that is comfortable.  Putting ice on the injured area.  Put ice in a bag.  Place a towel between your skin and the bag.  Leave the ice on for 15 to 20 minutes, 3 to 4 times a day.  Follow up with your caregiver for continued problems and no reason can be found for the pain. If the pain becomes worse or does not go away, it may be necessary to repeat tests or do additional testing. Your caregiver may need to look further for a possible cause. SEEK IMMEDIATE MEDICAL CARE IF:  You have pain that is getting worse and is not  relieved by medications.  You develop chest pain that is associated with shortness or breath, sweating, feeling sick to your stomach (nauseous), or throw up (vomit).  Your pain becomes localized to the abdomen.  You develop any new symptoms that seem different or that concern you. MAKE SURE YOU:   Understand these instructions.  Will watch your condition.  Will get help right away if you are not doing well or get worse. Document Released: 09/18/2005 Document Revised: 12/11/2011 Document Reviewed: 05/23/2013 [facility] Patient Information 2015 [place], [place]. This information is not intended to replace advice given to you by your health care provider. Make sure you discuss any questions you have with your health care provider.

## 2014-06-15 NOTE — ED Notes (Signed)
Patient states he has bad shoulder pain and bad neck pain from fall at 0800 yesterday morning.  Patient states since last night took 6 Klonopin (roomates medication) and 15 tramadol to help him sleep.

## 2014-06-15 NOTE — ED Provider Notes (Signed)
CSN: [id number]     Arrival date & time 06/15/14  4098 History   First MD Initiated Contact with Patient 06/15/14 0840     Chief Complaint  Patient presents with  . Neck Pain  . Shoulder Pain     (Consider location/radiation/quality/duration/timing/severity/associated sxs/prior Treatment) HPI Comments: Pt is a 38 y/o male who presents to the ED complaining of continued constant right shoulder pain and neck pain from a fall that occurred around 8:00 AM 1 day ago. Pt was seen in the ED after the fall, had images of right shoulder, clavicle and c-spine without any acute findings. States the pain has continued and is "really bad, making me cry at night" rated 10/10. He took 11 tramadol, 6 Klonopin (his roommates that is in prison) and ibuprofen with no relief. Last took tramadol at 3:00 AM. Pain worse with any movement. Denies fever or chills. It is noted he walked out of the ED yesterday angrily, swearing about his treatment.  Patient is a 38 y.o. male presenting with neck pain and shoulder pain. The history is provided by the patient and medical records.  Neck Pain Shoulder Pain Associated symptoms include neck pain.    History reviewed. No pertinent past medical history. Past Surgical History  Procedure Laterality Date  . Back surgery    . Cholecystectomy    . Appendectomy     Family History  Problem Relation Age of Onset  . Cancer Father    History  Substance Use Topics  . Smoking status: Current Every Day Smoker -- 1.00 packs/day    Types: Cigarettes  . Smokeless tobacco: Not on file  . Alcohol Use: Yes    Review of Systems  Constitutional: Negative.   HENT: Negative.   Musculoskeletal: Positive for neck pain.       +R shoulder pain.  Skin: Negative.   Neurological: Negative.       Allergies  Review of patient's allergies indicates no known allergies.  Home Medications   Prior to Admission medications   Medication Sig Start Date End Date Taking? Authorizing  Provider  clonazePAM (KLONOPIN) 1 MG tablet Take 1 mg by mouth once.    Historical Provider, MD  HYDROcodone-acetaminophen (NORCO/VICODIN) 5-325 MG per tablet Take 1-2 tablets by mouth every 6 (six) hours as needed for moderate pain. 06/14/14   Suzi Roots, MD  ibuprofen (ADVIL,MOTRIN) 600 MG tablet Take 1 tablet (600 mg total) by mouth every 8 (eight) hours as needed. Take with food. 06/14/14   Suzi Roots, MD   BP 108/79  Pulse 71  Temp(Src) 97.7 F (36.5 C) (Oral)  Resp 18  Ht  (1.854 m)  Wt 215 lb (97.523 kg)  BMI 28.37 kg/m2  SpO2 99% Physical Exam  Nursing note and vitals reviewed. Constitutional: He is oriented to person, place, and time. He appears well-developed and well-nourished. No distress.  HENT:  Head: Normocephalic and atraumatic.  Eyes: Conjunctivae and EOM are normal.  Neck: Normal range of motion. Neck supple.  Cardiovascular: Normal rate, regular rhythm, normal heart sounds and intact distal pulses.   Pulmonary/Chest: Effort normal and breath sounds normal.  Musculoskeletal:  TTP throughout entire right shoulder girdle. No deformity, swelling, bruising or signs of trauma. ROM limited by pain. TTP right neck paraspinal muscles. No spinous process tenderness. ROM limited when asking to perform ROM, otherwise moving neck around when asking HPI.  Neurological: He is alert and oriented to person, place, and time.  Skin: Skin is warm and  dry.  Psychiatric: He has a normal mood and affect. His behavior is normal.    ED Course  Procedures (including critical care time) Labs Review Labs Reviewed - No data to display  Imaging Review Dg Cervical Spine Complete  06/14/2014   CLINICAL DATA:  Fall with neck pain.  EXAM: CERVICAL SPINE  4+ VIEWS  COMPARISON:  10/05/2011 radiographs  FINDINGS: Reversal of the normal cervical lordosis is noted.  There is no evidence of acute fracture, subluxation or prevertebral soft tissue swelling.  No focal bony lesions are  identified. The disc spaces are maintained.  No bony foraminal narrowing identified.  IMPRESSION: Reversal of the normal cervical lordosis without fracture, subluxation or prevertebral soft tissue swelling.   Electronically Signed   By: Laveda Abbe M.D.   On: 06/14/2014 09:06   Dg Clavicle Right  06/14/2014   CLINICAL DATA:  38 year old male with right clavicle pain following fall.  EXAM: RIGHT CLAVICLE - 2+ VIEWS  COMPARISON:  None.  FINDINGS: There is no evidence of fracture or other focal bone lesions. Soft tissues are unremarkable.  IMPRESSION: Negative.   Electronically Signed   By: Laveda Abbe M.D.   On: 06/14/2014 09:04   Dg Shoulder Right  06/14/2014   CLINICAL DATA:  Fall with right shoulder injury and pain.  EXAM: RIGHT SHOULDER - 2+ VIEW  COMPARISON:  None.  FINDINGS: There is no evidence of fracture or dislocation. There is no evidence of arthropathy or other focal bone abnormality. Soft tissues are unremarkable.  IMPRESSION: Negative.   Electronically Signed   By: Laveda Abbe M.D.   On: 06/14/2014 09:09     EKG Interpretation None      MDM   Final diagnoses:  Right shoulder pain  Fall, subsequent encounter   Pt presenting with continued pain after being seen 1 day ago for the same with negative imaging studies. Noted to have left ED angrily. He is well appearing and in NAD. AFVSS. Discussed f/u with ortho if no improvement. Sling given for comfort. Continue tramadol. Stable for d/c. Return precautions given. Patient unhappy with care plan, however I feel this is most appropriate. I do not feel narcotics are appropriate, especially given pt taking his roommates benzo and the high amount of tramadol.  Trevor Mace, PA-C 06/15/14 [phone]

## 2014-06-15 NOTE — ED Provider Notes (Signed)
Medical screening examination/treatment/procedure(s) were performed by non-physician practitioner and as supervising physician I was immediately available for consultation/collaboration.  Edwardo Wojnarowski, MD 06/15/14 1607 

## 2014-08-07 ENCOUNTER — Ambulatory Visit: Payer: Self-pay

## 2014-08-24 ENCOUNTER — Encounter: Payer: Self-pay | Admitting: Internal Medicine

## 2014-08-24 ENCOUNTER — Ambulatory Visit: Payer: No Typology Code available for payment source | Attending: Internal Medicine | Admitting: Internal Medicine

## 2014-08-24 VITALS — BP 117/82 | HR 81 | Temp 98.0°F | Resp 16 | Wt 215.8 lb

## 2014-08-24 DIAGNOSIS — Z23 Encounter for immunization: Secondary | ICD-10-CM | POA: Insufficient documentation

## 2014-08-24 DIAGNOSIS — Z833 Family history of diabetes mellitus: Secondary | ICD-10-CM | POA: Insufficient documentation

## 2014-08-24 DIAGNOSIS — F17200 Nicotine dependence, unspecified, uncomplicated: Secondary | ICD-10-CM

## 2014-08-24 DIAGNOSIS — Z79891 Long term (current) use of opiate analgesic: Secondary | ICD-10-CM | POA: Insufficient documentation

## 2014-08-24 DIAGNOSIS — M25511 Pain in right shoulder: Secondary | ICD-10-CM | POA: Insufficient documentation

## 2014-08-24 DIAGNOSIS — F17210 Nicotine dependence, cigarettes, uncomplicated: Secondary | ICD-10-CM | POA: Insufficient documentation

## 2014-08-24 DIAGNOSIS — Z139 Encounter for screening, unspecified: Secondary | ICD-10-CM

## 2014-08-24 DIAGNOSIS — Z720 Tobacco use: Secondary | ICD-10-CM

## 2014-08-24 LAB — COMPLETE METABOLIC PANEL WITH GFR
ALT: 11 U/L (ref 0–53)
AST: 18 U/L (ref 0–37)
Albumin: 4.5 g/dL (ref 3.5–5.2)
Alkaline Phosphatase: 54 U/L (ref 39–117)
BILIRUBIN TOTAL: 0.5 mg/dL (ref 0.2–1.2)
BUN: 8 mg/dL (ref 6–23)
CALCIUM: 9.7 mg/dL (ref 8.4–10.5)
CHLORIDE: 107 meq/L (ref 96–112)
CO2: 22 mEq/L (ref 19–32)
CREATININE: 0.93 mg/dL (ref 0.50–1.35)
GFR, Est African American: >89 mL/min
GFR, Est Non African American: >89 mL/min
Glucose, Bld: 106 mg/dL — ABNORMAL HIGH (ref 70–99)
Potassium: 3.8 mEq/L (ref 3.5–5.3)
Sodium: 143 mEq/L (ref 135–145)
Total Protein: 6.4 g/dL (ref 6.0–8.3)

## 2014-08-24 LAB — HEMOGLOBIN A1C
HEMOGLOBIN A1C: 5.2 % (ref <5.7)
MEAN PLASMA GLUCOSE: 103 mg/dL (ref <117)

## 2014-08-24 LAB — CBC WITH DIFFERENTIAL/PLATELET
Basophils Absolute: 0 10*3/uL (ref 0.0–0.1)
Basophils Relative: 0 % (ref 0–1)
EOS ABS: 0.2 10*3/uL (ref 0.0–0.7)
EOS PCT: 3 % (ref 0–5)
HCT: 44.5 % (ref 39.0–52.0)
HEMOGLOBIN: 15.7 g/dL (ref 13.0–17.0)
LYMPHS ABS: 2.4 10*3/uL (ref 0.7–4.0)
Lymphocytes Relative: 32 % (ref 12–46)
MCH: 33.5 pg (ref 26.0–34.0)
MCHC: 35.3 g/dL (ref 30.0–36.0)
MCV: 94.9 fL (ref 78.0–100.0)
MONO ABS: 0.6 10*3/uL (ref 0.1–1.0)
MONOS PCT: 8 % (ref 3–12)
MPV: 10 fL (ref 9.4–12.4)
Neutro Abs: 4.3 10*3/uL (ref 1.7–7.7)
Neutrophils Relative %: 57 % (ref 43–77)
Platelets: 271 10*3/uL (ref 150–400)
RBC: 4.69 MIL/uL (ref 4.22–5.81)
RDW: 13.4 % (ref 11.5–15.5)
WBC: 7.5 10*3/uL (ref 4.0–10.5)

## 2014-08-24 LAB — TSH: TSH: 2.321 u[IU]/mL (ref 0.350–4.500)

## 2014-08-24 MED ORDER — IBUPROFEN 800 MG PO TABS: 800.0000 mg | ORAL_TABLET | Freq: Three times a day (TID) | ORAL | Status: AC | PRN

## 2014-08-24 MED ORDER — BUPROPION HCL ER (SR) 100 MG PO TB12: 100.0000 mg | ORAL_TABLET | Freq: Two times a day (BID) | ORAL | Status: AC

## 2014-08-24 NOTE — Progress Notes (Signed)
Patient here to establish care Patient states fell about two months ago Complains of pain to his right side of his neck and right shoulder pain

## 2014-08-24 NOTE — Progress Notes (Signed)
Patient Demographics  Brett Stokes, is a 38 y.o. male  UJW:[id number]:[id number]  FAO:[id number]  DOB - 06-09-1976  CC:  Chief Complaint  Patient presents with  . new patient       HPI: Brett Curtimothy Curnow is a 38 y.o. male here today to establish medical care.patient complains of right shoulder pain, 2 months ago patient went to the emergency room after a fall, EMR reviewed patient had x-rays done on the neck and shoulder which were apparently negative for any acute pathology, patient does complains of pain and has limited range of motion, has been using ibuprofen when necessary, patient also smokes cigarettes, advised patient to quit smoking, patient also has family history of diabetes. Patient has No headache, No chest pain, No abdominal pain - No Nausea, No new weakness tingling or numbness, No Cough - SOB.  No Known Allergies History reviewed. No pertinent past medical history. Current Outpatient Prescriptions on File Prior to Visit  Medication Sig Dispense Refill  . clonazePAM (KLONOPIN) 1 MG tablet Take 1 mg by mouth once.    Marland Kitchen. HYDROcodone-acetaminophen (NORCO/VICODIN) 5-325 MG per tablet Take 1-2 tablets by mouth every 6 (six) hours as needed for moderate pain. 20 tablet 0   No current facility-administered medications on file prior to visit.   Family History  Problem Relation Age of Onset  . Cancer Father   . Stroke Father   . Diabetes Maternal Grandmother    History   Social History  . Marital Status: Divorced    Spouse Name: N/A    Number of Children: N/A  . Years of Education: N/A   Occupational History  . Not on file.   Social History Main Topics  . Smoking status: Current Every Day Smoker -- 1.00 packs/day for 20 years    Types: Cigarettes  . Smokeless tobacco: Not on file  . Alcohol Use: 0.0 oz/week    0 Not specified per week     Comment: socially   . Drug Use: No     Comment: last use several months   . Sexual Activity: Not on file   Other Topics Concern   . Not on file   Social History Narrative    Review of Systems: Constitutional: Negative for fever, chills, diaphoresis, activity change, appetite change and fatigue. HENT: Negative for ear pain, nosebleeds, congestion, facial swelling, rhinorrhea, neck pain, neck stiffness and ear discharge.  Eyes: Negative for pain, discharge, redness, itching and visual disturbance. Respiratory: Negative for cough, choking, chest tightness, shortness of breath, wheezing and stridor.  Cardiovascular: Negative for chest pain, palpitations and leg swelling. Gastrointestinal: Negative for abdominal distention. Genitourinary: Negative for dysuria, urgency, frequency, hematuria, flank pain, decreased urine volume, difficulty urinating and dyspareunia.  Musculoskeletal: Negative for back pain, joint swelling, arthralgia and gait problem. Neurological: Negative for dizziness, tremors, seizures, syncope, facial asymmetry, speech difficulty, weakness, light-headedness, numbness and headaches.  Hematological: Negative for adenopathy. Does not bruise/bleed easily. Psychiatric/Behavioral: Negative for hallucinations, behavioral problems, confusion, dysphoric mood, decreased concentration and agitation.    Objective:   Filed Vitals:   08/24/14 1120  BP: 117/82  Pulse: 81  Temp: 98 F (36.7 C)  Resp: 16    Physical Exam: Constitutional: Patient appears well-developed and well-nourished. No distress. HENT: Normocephalic, atraumatic, External right and left ear normal. Oropharynx is clear and moist.  Eyes: Conjunctivae and EOM are normal. PERRLA, no scleral icterus. Neck: Normal ROM. Neck supple. No JVD. No tracheal deviation. No thyromegaly. CVS: RRR, S1/S2 +, no  murmurs, no gallops, no carotid bruit.  Pulmonary: Effort and breath sounds normal, no stridor, rhonchi, wheezes, rales.  Abdominal: Soft. BS +, no distension, tenderness, rebound or guarding.  Musculoskeletal: Normal range of motion. No edema and  no tenderness. Right limited ROM  Neuro: Alert. Normal reflexes, muscle tone coordination. No cranial nerve deficit. Skin: Skin is warm and dry. No rash noted. Not diaphoretic. No erythema. No pallor. Psychiatric: Normal mood and affect. Behavior, judgment, thought content normal.  Lab Results  Component Value Date   WBC 9.6 07/08/2011   HGB 15.1 07/08/2011   HCT 41.6 07/08/2011   MCV 93.1 07/08/2011   PLT 218 07/08/2011   Lab Results  Component Value Date   CREATININE 0.87 07/08/2011   BUN 8 07/08/2011   NA 142 07/08/2011   K 3.6 07/08/2011   CL 107 07/08/2011   CO2 24 07/08/2011    No results found for: HGBA1C Lipid Panel  No results found for: CHOL, TRIG, HDL, CHOLHDL, VLDL, LDLCALC     Assessment and plan:   1. Smoking I have prescribed Wellbutrin. - buPROPion (WELLBUTRIN SR) 100 MG 12 hr tablet; Take 1 tablet (100 mg total) by mouth 2 (two) times daily.  Dispense: 60 tablet; Refill: 3  2. Family history of diabetes mellitus (DM)  - Hemoglobin A1c  3. Right shoulder pain  - MR Shoulder Right Wo Contrast; Future - ibuprofen (ADVIL,MOTRIN) 800 MG tablet; Take 1 tablet (800 mg total) by mouth every 8 (eight) hours as needed. Take with food.  Dispense: 60 tablet; Refill: 0  4. Screening Ordered baseline blood work.  - COMPLETE METABOLIC PANEL WITH GFR - CBC with Differential - TSH - Vit D  25 hydroxy (rtn osteoporosis monitoring)    Health Maintenance : -Vaccinations:  pneumoax today   Return in about 3 months (around 11/24/2014) for shoulder pain .   Doris CheadleADVANI, Sheralee Qazi, MD

## 2014-08-25 ENCOUNTER — Telehealth: Payer: Self-pay | Admitting: Emergency Medicine

## 2014-08-25 LAB — VITAMIN D 25 HYDROXY (VIT D DEFICIENCY, FRACTURES): Vit D, 25-Hydroxy: 35 ng/mL (ref 30–100)

## 2014-08-25 NOTE — Telephone Encounter (Signed)
Pt given normal blood work 

## 2014-08-25 NOTE — Telephone Encounter (Signed)
-----   Message from Doris Cheadleeepak Advani, MD sent at 08/25/2014  9:30 AM EST ----- Call and let the Patient know that blood work is normal.

## 2014-09-09 ENCOUNTER — Ambulatory Visit (HOSPITAL_COMMUNITY)
Admission: RE | Admit: 2014-09-09 | Discharge: 2014-09-09 | Disposition: A | Discharge status: Home / Self Care | Payer: No Typology Code available for payment source | Source: Ambulatory Visit | Attending: Internal Medicine | Admitting: Internal Medicine

## 2014-09-09 DIAGNOSIS — M25511 Pain in right shoulder: Secondary | ICD-10-CM | POA: Insufficient documentation

## 2014-09-16 ENCOUNTER — Telehealth: Payer: Self-pay | Admitting: Internal Medicine

## 2014-09-16 NOTE — Telephone Encounter (Signed)
Patient calling to request MRI results. MRI was performed on 09/09/14. Please assist

## 2014-09-19 ENCOUNTER — Encounter (HOSPITAL_COMMUNITY): Payer: Self-pay | Admitting: *Deleted

## 2014-09-19 ENCOUNTER — Emergency Department (HOSPITAL_COMMUNITY)
Admission: EM | Admit: 2014-09-19 | Discharge: 2014-09-19 | Disposition: A | Discharge status: Home / Self Care | Payer: No Typology Code available for payment source | Attending: Emergency Medicine | Admitting: Emergency Medicine

## 2014-09-19 DIAGNOSIS — Y998 Other external cause status: Secondary | ICD-10-CM | POA: Insufficient documentation

## 2014-09-19 DIAGNOSIS — S43431A Superior glenoid labrum lesion of right shoulder, initial encounter: Secondary | ICD-10-CM

## 2014-09-19 DIAGNOSIS — Y9389 Activity, other specified: Secondary | ICD-10-CM | POA: Insufficient documentation

## 2014-09-19 DIAGNOSIS — S3992XA Unspecified injury of lower back, initial encounter: Secondary | ICD-10-CM | POA: Insufficient documentation

## 2014-09-19 DIAGNOSIS — Z79899 Other long term (current) drug therapy: Secondary | ICD-10-CM | POA: Insufficient documentation

## 2014-09-19 DIAGNOSIS — S199XXA Unspecified injury of neck, initial encounter: Secondary | ICD-10-CM | POA: Insufficient documentation

## 2014-09-19 DIAGNOSIS — X58XXXA Exposure to other specified factors, initial encounter: Secondary | ICD-10-CM | POA: Insufficient documentation

## 2014-09-19 DIAGNOSIS — S43401A Unspecified sprain of right shoulder joint, initial encounter: Secondary | ICD-10-CM | POA: Insufficient documentation

## 2014-09-19 DIAGNOSIS — Z720 Tobacco use: Secondary | ICD-10-CM | POA: Insufficient documentation

## 2014-09-19 DIAGNOSIS — Y9289 Other specified places as the place of occurrence of the external cause: Secondary | ICD-10-CM | POA: Insufficient documentation

## 2014-09-19 DIAGNOSIS — Z9889 Other specified postprocedural states: Secondary | ICD-10-CM | POA: Insufficient documentation

## 2014-09-19 MED ORDER — HYDROCODONE-ACETAMINOPHEN 5-325 MG PO TABS: 1.0000 | ORAL_TABLET | ORAL | Status: DC | PRN

## 2014-09-19 MED ORDER — METHOCARBAMOL 500 MG PO TABS
500.0000 mg | ORAL_TABLET | Freq: Once | ORAL | Status: AC
  Administered 2014-09-19: 500 mg via ORAL
  Filled 2014-09-19: qty 1

## 2014-09-19 MED ORDER — ONDANSETRON 4 MG PO TBDP: 4.0000 mg | ORAL_TABLET | Freq: Three times a day (TID) | ORAL | Status: AC | PRN

## 2014-09-19 MED ORDER — METHOCARBAMOL 500 MG PO TABS: 500.0000 mg | ORAL_TABLET | Freq: Two times a day (BID) | ORAL | Status: AC

## 2014-09-19 NOTE — ED Notes (Signed)
Declined W/C at D/C and was escorted to lobby by RN. 

## 2014-09-19 NOTE — ED Provider Notes (Signed)
CSN: [id number]     Arrival date & time 09/19/14  1724 History  This chart was scribed for non-physician practitioner working with Purvis SheffieldForrest Harrison, MD by Richarda Overlieichard Holland, ED Scribe. This patient was seen in room TR06C/TR06C and the patient's care was started at 6:05 PM.     Chief Complaint  Patient presents with  . Neck Pain  . Back Pain   The history is provided by the patient. No language interpreter was used.   HPI Comments: Brett Stokes is a 38 y.o. male who presents to the Emergency Department for the results of his MRI [facility]14 of his right shoulder. Pt reports that certain movements worsens his shoulder pain. Pt also complains of neck and back pain at this time and says he was supposed to receive an MRI for his back but did not. He states he has been taking ibuprofen for his pain which has failed to provide pain relief. Pt reports no pertinent past medical history at this time. He reports NKDA.    History reviewed. No pertinent past medical history. Past Surgical History  Procedure Laterality Date  . Back surgery    . Cholecystectomy    . Appendectomy     Family History  Problem Relation Age of Onset  . Cancer Father   . Stroke Father   . Diabetes Maternal Grandmother    History  Substance Use Topics  . Smoking status: Current Every Day Smoker -- 1.00 packs/day for 20 years    Types: Cigarettes  . Smokeless tobacco: Not on file  . Alcohol Use: 0.0 oz/week    0 Not specified per week     Comment: socially     Review of Systems  Musculoskeletal: Positive for myalgias, back pain and neck pain.  All other systems reviewed and are negative.   Allergies  Review of patient's allergies indicates no known allergies.  Home Medications   Prior to Admission medications   Medication Sig Start Date End Date Taking? Authorizing Provider  buPROPion (WELLBUTRIN SR) 100 MG 12 hr tablet Take 1 tablet (100 mg total) by  mouth 2 (two) times daily. 08/24/14   Doris Cheadleeepak Advani, MD  clonazePAM (KLONOPIN) 1 MG tablet Take 1 mg by mouth once.    Historical Provider, MD  HYDROcodone-acetaminophen (NORCO/VICODIN) 5-325 MG per tablet Take 1-2 tablets by mouth every 6 (six) hours as needed for moderate pain. 06/14/14   Suzi RootsKevin E Steinl, MD  HYDROcodone-acetaminophen (NORCO/VICODIN) 5-325 MG per tablet Take 1-2 tablets by mouth every 4 (four) hours as needed for severe pain. 09/19/14   Jacson Rapaport L Austine Wiedeman, PA-C  ibuprofen (ADVIL,MOTRIN) 800 MG tablet Take 1 tablet (800 mg total) by mouth every 8 (eight) hours as needed. Take with food. 08/24/14   Doris Cheadleeepak Advani, MD  methocarbamol (ROBAXIN) 500 MG tablet Take 1 tablet (500 mg total) by mouth 2 (two) times daily. 09/19/14   Veronica Guerrant L Adolf Ormiston, PA-C  ondansetron (ZOFRAN ODT) 4 MG disintegrating tablet Take 1 tablet (4 mg total) by mouth every 8 (eight) hours as needed for nausea or vomiting. 09/19/14   Victorino DikeJennifer L Kaydi Kley, PA-C   BP 120/83 mmHg  Pulse 64  Temp(Src) 98.6 F (37 C) (Oral)  Resp 16  SpO2 99% Physical Exam  Constitutional: He is oriented to person, place, and time. He appears well-developed and well-nourished. No distress.  HENT:  Head: Normocephalic and atraumatic.  Right Ear: External ear normal.  Left Ear: External ear  normal.  Nose: Nose normal.  Mouth/Throat: Oropharynx is clear and moist.  Eyes: Conjunctivae are normal.  Neck: Normal range of motion. Neck supple. Muscular tenderness present. No spinous process tenderness present.    Cardiovascular: Normal rate.   Pulmonary/Chest: Effort normal.  Abdominal: Soft.  Musculoskeletal:       Right shoulder: He exhibits tenderness. He exhibits normal range of motion (Painful range of motion ).       Left shoulder: Normal. He exhibits normal range of motion and no tenderness.  Neurological: He is alert and oriented to person, place, and time.  Skin: Skin is warm and dry. He is not diaphoretic.   Psychiatric: He has a normal mood and affect.  Nursing note and vitals reviewed.   ED Course  Procedures   Medications  methocarbamol (ROBAXIN) tablet 500 mg (500 mg Oral Given 09/19/14 1826)    DIAGNOSTIC STUDIES: Oxygen Saturation is 96% on RA, normal by my interpretation.    COORDINATION OF CARE: 6:10 PM Discussed treatment plan with pt at bedside and pt agreed to plan.   Labs Review Labs Reviewed - No data to display  Imaging Review No results found.   EKG Interpretation None      MDM   Final diagnoses:  Labral tear of shoulder, right, initial encounter   Filed Vitals:   09/19/14 1843  BP: 120/83  Pulse: 64  Temp: 98.6 F (37 C)  Resp: 16   Afebrile, NAD, non-toxic appearing, AAOx4.  I have reviewed nursing notes, vital signs, and all appropriate lab and imaging results for this patient.  MRI results reviewed from 09/09/14. Neurovascularly intact. Normal sensation. No evidence of compartment syndrome. PE shows no instability, tenderness, or deformity of acromioclavicular and sternoclavicular joints, the cervical spine, glenohumeral joint, coracoid process, acromion, or scapula. No signs of impingement on Adson's maneuver. Advised orthopedic follow up. Return precautions discussed. Patient is agreeable to plan.  Patient is stable at time of discharge    I personally performed the services described in this documentation, which was scribed in my presence. The recorded information has been reviewed and is accurate.        Jeannetta EllisJennifer L Renly Guedes, PA-C 09/19/14 1851  Purvis SheffieldForrest Harrison, MD 09/20/14 [phone]

## 2014-09-19 NOTE — ED Notes (Addendum)
PT reports MRI was done 09-09-14 and is waiting results from test. Pt reports on going pain to rt shoulder and right of neck.

## 2014-09-19 NOTE — ED Notes (Signed)
Pt reports having neck pain, right shoulder pain and lower back pain, hx of same, denies new injury. Pt is ambulatory at triage.

## 2014-09-19 NOTE — Discharge Instructions (Signed)
Please follow up with your primary care physician in 1-2 days. If you do not have one please call the [facility] number listed above. Please follow up with Dr. Dion Stokes to schedule a follow up appointment.  Please take pain medication and/or muscle relaxants as prescribed and as needed for pain. Please do not drive on narcotic pain medication or on muscle relaxants. Please read all discharge instructions and return precautions.   Shoulder Pain The shoulder is the joint that connects your arms to your body. The bones that form the shoulder joint include the upper arm bone (humerus), the shoulder blade (scapula), and the collarbone (clavicle). The top of the humerus is shaped like a ball and fits into a rather flat socket on the scapula (glenoid cavity). A combination of muscles and strong, fibrous tissues that connect muscles to bones (tendons) support your shoulder joint and hold the ball in the socket. Small, fluid-filled sacs (bursae) are located in different areas of the joint. They act as cushions between the bones and the overlying soft tissues and help reduce friction between the gliding tendons and the bone as you move your arm. Your shoulder joint allows a wide range of motion in your arm. This range of motion allows you to do things like scratch your back or throw a ball. However, this range of motion also makes your shoulder more prone to pain from overuse and injury. Causes of shoulder pain can originate from both injury and overuse and usually can be grouped in the following four categories:  Redness, swelling, and pain (inflammation) of the tendon (tendinitis) or the bursae (bursitis).  Instability, such as a dislocation of the joint.  Inflammation of the joint (arthritis).  Broken bone (fracture). HOME CARE INSTRUCTIONS   Apply ice to the sore area.  Put ice in a plastic bag.  Place a towel between your skin and the bag.  Leave the ice on for 15-20 minutes, 3-4  times per day for the first 2 days, or as directed by your health care provider.  Stop using cold packs if they do not help with the pain.  If you have a shoulder sling or immobilizer, wear it as long as your caregiver instructs. Only remove it to shower or bathe. Move your arm as little as possible, but keep your hand moving to prevent swelling.  Squeeze a soft ball or foam pad as much as possible to help prevent swelling.  Only take over-the-counter or prescription medicines for pain, discomfort, or fever as directed by your caregiver. SEEK MEDICAL CARE IF:   Your shoulder pain increases, or new pain develops in your arm, hand, or fingers.  Your hand or fingers become cold and numb.  Your pain is not relieved with medicines. SEEK IMMEDIATE MEDICAL CARE IF:   Your arm, hand, or fingers are numb or tingling.  Your arm, hand, or fingers are significantly swollen or turn white or blue. MAKE SURE YOU:   Understand these instructions.  Will watch your condition.  Will get help right away if you are not doing well or get worse. Document Released: 06/28/2005 Document Revised: 02/02/2014 Document Reviewed: 09/02/2011 [facility] Patient Information 2015 [employer] [place] [place]. This information is not intended to replace advice given to you by your health care provider. Make sure you discuss any questions you have with your health care provider.

## 2014-09-23 ENCOUNTER — Telehealth: Payer: Self-pay | Admitting: Emergency Medicine

## 2014-09-23 ENCOUNTER — Ambulatory Visit: Payer: No Typology Code available for payment source | Admitting: Internal Medicine

## 2014-09-23 DIAGNOSIS — M6780 Other specified disorders of synovium and tendon, unspecified site: Secondary | ICD-10-CM

## 2014-09-23 NOTE — Telephone Encounter (Signed)
Pt given MRI results with orthopedic referral Instructed to f/u with specialist  Requesting MRI back/right shoulder which he states was discussed during last OV

## 2014-11-09 ENCOUNTER — Ambulatory Visit: Payer: No Typology Code available for payment source | Attending: Internal Medicine | Admitting: Internal Medicine

## 2014-11-09 ENCOUNTER — Encounter: Payer: Self-pay | Admitting: Internal Medicine

## 2014-11-09 VITALS — BP 136/85 | HR 92 | Temp 98.0°F | Resp 16 | Wt 219.4 lb

## 2014-11-09 DIAGNOSIS — Z9889 Other specified postprocedural states: Secondary | ICD-10-CM | POA: Insufficient documentation

## 2014-11-09 DIAGNOSIS — M6790 Unspecified disorder of synovium and tendon, unspecified site: Secondary | ICD-10-CM

## 2014-11-09 DIAGNOSIS — F17200 Nicotine dependence, unspecified, uncomplicated: Secondary | ICD-10-CM

## 2014-11-09 DIAGNOSIS — M545 Low back pain: Secondary | ICD-10-CM

## 2014-11-09 DIAGNOSIS — Z720 Tobacco use: Secondary | ICD-10-CM

## 2014-11-09 DIAGNOSIS — G8929 Other chronic pain: Secondary | ICD-10-CM

## 2014-11-09 DIAGNOSIS — M6780 Other specified disorders of synovium and tendon, unspecified site: Secondary | ICD-10-CM

## 2014-11-09 DIAGNOSIS — M25511 Pain in right shoulder: Secondary | ICD-10-CM

## 2014-11-09 NOTE — Progress Notes (Signed)
Patient here for follow up on his neck pain Right shoulder pain and  Back pain Complains of having some tingling in his left left Patient complains of the fingers in his right hand have no feeling Needs referral to orthopedic surgeon

## 2014-11-09 NOTE — Progress Notes (Signed)
MRN: [id number] Name: Brett Stokes  Sex: male Age: 39 y.o. DOB: Feb 16, 1976  Allergies: Review of patient's allergies indicates no known allergies.  Chief Complaint  Patient presents with  . Follow-up    HPI: Patient is 39 y.o. male who comes today still complaining of right shoulder pain, he had MRI done which reported tendinosis, apparently patient was referred to sports medicine and as per patient he was told to make a appointment with orthopedics, he is requesting referral, he also complains of chronic lower back pain as per patient he had a surgery done in the past, denies any incontinence denies any fever chills, occasionally has numbness tingling.  History reviewed. No pertinent past medical history.  Past Surgical History  Procedure Laterality Date  . Back surgery    . Cholecystectomy    . Appendectomy        Medication List       This list is accurate as of: 11/09/14  1:17 PM.  Always use your most recent med list.               buPROPion 100 MG 12 hr tablet  Commonly known as:  WELLBUTRIN SR  Take 1 tablet (100 mg total) by mouth 2 (two) times daily.     clonazePAM 1 MG tablet  Commonly known as:  KLONOPIN  Take 1 mg by mouth once.     HYDROcodone-acetaminophen 5-325 MG per tablet  Commonly known as:  NORCO/VICODIN  Take 1-2 tablets by mouth every 6 (six) hours as needed for moderate pain.     HYDROcodone-acetaminophen 5-325 MG per tablet  Commonly known as:  NORCO/VICODIN  Take 1-2 tablets by mouth every 4 (four) hours as needed for severe pain.     ibuprofen 800 MG tablet  Commonly known as:  ADVIL,MOTRIN  Take 1 tablet (800 mg total) by mouth every 8 (eight) hours as needed. Take with food.     methocarbamol 500 MG tablet  Commonly known as:  ROBAXIN  Take 1 tablet (500 mg total) by mouth 2 (two) times daily.     ondansetron 4 MG disintegrating tablet  Commonly known as:  ZOFRAN ODT  Take 1 tablet (4 mg total) by mouth every 8 (eight) hours  as needed for nausea or vomiting.        No orders of the defined types were placed in this encounter.    Immunization History  Administered Date(s) Administered  . Pneumococcal Polysaccharide-23 08/24/2014    Family History  Problem Relation Age of Onset  . Cancer Father   . Stroke Father   . Diabetes Maternal Grandmother     History  Substance Use Topics  . Smoking status: Current Every Day Smoker -- 1.00 packs/day for 20 years    Types: Cigarettes  . Smokeless tobacco: Not on file  . Alcohol Use: 0.0 oz/week    0 Not specified per week     Comment: socially     Review of Systems   As noted in HPI  Filed Vitals:   11/09/14 1205  BP: 136/85  Pulse: 92  Temp: 98 F (36.7 C)  Resp: 16    Physical Exam  Physical Exam  Eyes: EOM are normal. Pupils are equal, round, and reactive to light.  Cardiovascular: Normal rate and regular rhythm.   Pulmonary/Chest: Breath sounds normal. No respiratory distress. He has no wheezes. He has no rales.  Musculoskeletal:  Lower back old mid line surgical scar some paraspinal tenderness,  SLR negative, DTR2+  Right shoulder limited ROM     CBC    Component Value Date/Time   WBC 7.5 08/24/2014 1155   RBC 4.69 08/24/2014 1155   HGB 15.7 08/24/2014 1155   HCT 44.5 08/24/2014 1155   PLT 271 08/24/2014 1155   MCV 94.9 08/24/2014 1155   LYMPHSABS 2.4 08/24/2014 1155   MONOABS 0.6 08/24/2014 1155   EOSABS 0.2 08/24/2014 1155   BASOSABS 0.0 08/24/2014 1155    CMP     Component Value Date/Time   NA 143 08/24/2014 1155   K 3.8 08/24/2014 1155   CL 107 08/24/2014 1155   CO2 22 08/24/2014 1155   GLUCOSE 106* 08/24/2014 1155   BUN 8 08/24/2014 1155   CREATININE 0.93 08/24/2014 1155   CREATININE 0.87 07/08/2011 0538   CALCIUM 9.7 08/24/2014 1155   PROT 6.4 08/24/2014 1155   ALBUMIN 4.5 08/24/2014 1155   AST 18 08/24/2014 1155   ALT 11 08/24/2014 1155   ALKPHOS 54 08/24/2014 1155   BILITOT 0.5 08/24/2014 1155    GFRNONAA >89 08/24/2014 1155   GFRNONAA >90 07/08/2011 0538   GFRAA >89 08/24/2014 1155   GFRAA >90 07/08/2011 0538    No results found for: CHOL  No components found for: HGA1C  Lab Results  Component Value Date/Time   AST 18 08/24/2014 11:55 AM    Assessment and Plan  Right shoulder pain/Tendinosis - Plan: Ambulatory referral to Orthopedic Surgery  Chronic lower back pain/History of back surgery  - Plan: Ambulatory referral to Orthopedic Surgery  Smoking Again counseled patient to quit smoking.  History of back surgery   Health Maintenance  -Vaccinations:  uptodate with pneumovax declines flu shot   Return in about 3 months (around 02/07/2015), or if symptoms worsen or fail to improve.  Doris Cheadle, MD

## 2014-11-27 ENCOUNTER — Telehealth: Payer: Self-pay | Admitting: Internal Medicine

## 2014-11-27 NOTE — Telephone Encounter (Signed)
Patient would like a referral to a neurologist. Please follow up with pt.

## 2014-11-30 NOTE — Telephone Encounter (Signed)
Left message to call back  

## 2014-11-30 NOTE — Telephone Encounter (Signed)
Patient has already been referred to ortho for back and shoulder pain. What is the concern that he prefers neurology?

## 2014-12-02 ENCOUNTER — Telehealth: Payer: Self-pay | Admitting: *Deleted

## 2014-12-02 NOTE — Telephone Encounter (Signed)
Pt requesting referral to neurology due to neck, back and shoulder pain Orthopedic recommended to go to neurology

## 2014-12-22 ENCOUNTER — Inpatient Hospital Stay (HOSPITAL_COMMUNITY): Admission: RE | Admit: 2014-12-22 | Payer: No Typology Code available for payment source | Source: Ambulatory Visit

## 2014-12-22 ENCOUNTER — Encounter (HOSPITAL_COMMUNITY): Payer: Self-pay

## 2014-12-22 ENCOUNTER — Encounter (HOSPITAL_COMMUNITY)
Admission: RE | Admit: 2014-12-22 | Discharge: 2014-12-22 | Disposition: A | Discharge status: Home / Self Care | Payer: Self-pay | Source: Ambulatory Visit | Attending: Orthopedic Surgery | Admitting: Orthopedic Surgery

## 2014-12-22 ENCOUNTER — Other Ambulatory Visit (HOSPITAL_COMMUNITY): Payer: Self-pay | Admitting: Orthopedic Surgery

## 2014-12-22 ENCOUNTER — Other Ambulatory Visit (HOSPITAL_COMMUNITY): Payer: Self-pay | Admitting: *Deleted

## 2014-12-22 HISTORY — DX: Constipation, unspecified: K59.00

## 2014-12-22 HISTORY — DX: Unspecified osteoarthritis, unspecified site: M19.90

## 2014-12-22 HISTORY — DX: Headache, unspecified: R51.9

## 2014-12-22 HISTORY — DX: Anxiety disorder, unspecified: F41.9

## 2014-12-22 HISTORY — DX: Headache: R51

## 2014-12-22 HISTORY — DX: Gastro-esophageal reflux disease without esophagitis: K21.9

## 2014-12-22 LAB — COMPREHENSIVE METABOLIC PANEL
ALT: 22 U/L (ref 0–53)
AST: 26 U/L (ref 0–37)
Albumin: 4.4 g/dL (ref 3.5–5.2)
Alkaline Phosphatase: 55 U/L (ref 39–117)
Anion gap: 9 (ref 5–15)
BILIRUBIN TOTAL: 0.7 mg/dL (ref 0.3–1.2)
BUN: 11 mg/dL (ref 6–23)
CO2: 23 mmol/L (ref 19–32)
Calcium: 9.5 mg/dL (ref 8.4–10.5)
Chloride: 105 mmol/L (ref 96–112)
Creatinine, Ser: 0.89 mg/dL (ref 0.50–1.35)
GFR calc Af Amer: >90 mL/min (ref >90)
Glucose, Bld: 96 mg/dL (ref 70–99)
Potassium: 3.9 mmol/L (ref 3.5–5.1)
SODIUM: 137 mmol/L (ref 135–145)
Total Protein: 7.3 g/dL (ref 6.0–8.3)

## 2014-12-22 LAB — CBC
HEMATOCRIT: 44.3 % (ref 39.0–52.0)
Hemoglobin: 15.3 g/dL (ref 13.0–17.0)
MCH: 33 pg (ref 26.0–34.0)
MCHC: 34.5 g/dL (ref 30.0–36.0)
MCV: 95.5 fL (ref 78.0–100.0)
Platelets: 214 10*3/uL (ref 150–400)
RBC: 4.64 MIL/uL (ref 4.22–5.81)
RDW: 13 % (ref 11.5–15.5)
WBC: 8.3 10*3/uL (ref 4.0–10.5)

## 2014-12-22 LAB — PROTIME-INR
INR: 1.06 (ref 0.00–1.49)
Prothrombin Time: 13.9 seconds (ref 11.6–15.2)

## 2014-12-22 LAB — APTT: aPTT: 32 seconds (ref 24–37)

## 2014-12-22 NOTE — Progress Notes (Signed)
No pre-op orders in [facility]. Called Dr. Audrie Liauda's office and spoke with Misty StanleyLisa, she states she will send a message to Dr. [facility]uda [facility]

## 2014-12-22 NOTE — Pre-Procedure Instructions (Signed)
Brett Stokes  12/22/2014   Your procedure is scheduled on:  Friday, January 01, 2015 at 2:10 PM.   Report to [facility] Entrance "A" Admitting Office at 12:10 PM.   Call this number if you have problems the morning of surgery: [phone]               Any questions prior to day of surgery, please call [phone] between 8 & 4 PM.   Remember:   Do not eat food or drink liquids after midnight Thursday, 12/31/14.   Take these medicines the morning of surgery with A SIP OF WATER: None   Do not wear jewelry.  Do not wear lotions, powders, or cologne. You may NOT wear deodorant.  Men may shave face and neck.  Do not bring valuables to the hospital.  [facility] is not responsible                  for any belongings or valuables.               Contacts, dentures or bridgework may not be worn into surgery.  Leave suitcase in the car. After surgery it may be brought to your room.  For patients admitted to the hospital, discharge time is determined by your                treatment team.               Patients discharged the day of surgery will not be allowed to drive home.    Special Instructions: [place] - Preparing for Surgery  Before surgery, you can play an important role.  Because skin is not sterile, your skin needs to be as free of germs as possible.  You can reduce the number of germs on you skin by washing with CHG (chlorahexidine gluconate) soap before surgery.  CHG is an antiseptic cleaner which kills germs and bonds with the skin to continue killing germs even after washing.  Please DO NOT use if you have an allergy to CHG or antibacterial soaps.  If your skin becomes reddened/irritated stop using the CHG and inform your nurse when you arrive at Short Stay.  Do not shave (including legs and underarms) for at least 48 hours prior to the first CHG shower.  You may shave your face.  Please follow these instructions carefully:   1.  Shower with CHG Soap the night  before surgery and the                                morning of Surgery.  2.  If you choose to wash your hair, wash your hair first as usual with your       normal shampoo.  3.  After you shampoo, rinse your hair and body thoroughly to remove the                      Shampoo.  4.  Use CHG as you would any other liquid soap.  You can apply chg directly       to the skin and wash gently with scrungie or a clean washcloth.  5.  Apply the CHG Soap to your body ONLY FROM THE NECK DOWN.        Do not use on open wounds or open sores.  Avoid contact with your eyes, ears, mouth and genitals (private  parts).  Wash genitals (private parts) with your normal soap.  6.  Wash thoroughly, paying special attention to the area where your surgery        will be performed.  7.  Thoroughly rinse your body with warm water from the neck down.  8.  DO NOT shower/wash with your normal soap after using and rinsing off       the CHG Soap.  9.  Pat yourself dry with a clean towel.            10.  Wear clean pajamas.            11.  Place clean sheets on your bed the night of your first shower and do not        sleep with pets.  Day of Surgery  Do not apply any lotions/deodorants the morning of surgery.  Please wear clean clothes to the hospital.     Please read over the following fact sheets that you were given: Pain Booklet, Coughing and Deep Breathing and Surgical Site Infection Prevention

## 2014-12-31 MED ORDER — CEFAZOLIN SODIUM-DEXTROSE 2-3 GM-% IV SOLR
2.0000 g | INTRAVENOUS | Status: AC
  Administered 2015-01-01: 2 g via INTRAVENOUS
  Filled 2014-12-31: qty 50

## 2014-12-31 NOTE — Progress Notes (Signed)
Pt notified of time change;to arrive at 1130-verbalized understanding

## 2015-01-01 ENCOUNTER — Encounter (HOSPITAL_COMMUNITY)
Admission: RE | Disposition: A | Discharge status: Home / Self Care | Payer: Self-pay | Source: Ambulatory Visit | Attending: Orthopedic Surgery

## 2015-01-01 ENCOUNTER — Ambulatory Visit (HOSPITAL_COMMUNITY): Payer: Self-pay | Admitting: Anesthesiology

## 2015-01-01 ENCOUNTER — Encounter (HOSPITAL_COMMUNITY): Payer: Self-pay | Admitting: *Deleted

## 2015-01-01 ENCOUNTER — Ambulatory Visit (HOSPITAL_COMMUNITY)
Admission: RE | Admit: 2015-01-01 | Discharge: 2015-01-01 | Disposition: A | Discharge status: Home / Self Care | Payer: Self-pay | Source: Ambulatory Visit | Attending: Orthopedic Surgery | Admitting: Orthopedic Surgery

## 2015-01-01 ENCOUNTER — Ambulatory Visit (HOSPITAL_COMMUNITY): Payer: No Typology Code available for payment source | Admitting: Anesthesiology

## 2015-01-01 DIAGNOSIS — S43431A Superior glenoid labrum lesion of right shoulder, initial encounter: Secondary | ICD-10-CM | POA: Insufficient documentation

## 2015-01-01 DIAGNOSIS — Y9289 Other specified places as the place of occurrence of the external cause: Secondary | ICD-10-CM | POA: Insufficient documentation

## 2015-01-01 DIAGNOSIS — X58XXXA Exposure to other specified factors, initial encounter: Secondary | ICD-10-CM | POA: Insufficient documentation

## 2015-01-01 DIAGNOSIS — M7541 Impingement syndrome of right shoulder: Secondary | ICD-10-CM | POA: Insufficient documentation

## 2015-01-01 DIAGNOSIS — M75111 Incomplete rotator cuff tear or rupture of right shoulder, not specified as traumatic: Secondary | ICD-10-CM | POA: Insufficient documentation

## 2015-01-01 DIAGNOSIS — Y998 Other external cause status: Secondary | ICD-10-CM | POA: Insufficient documentation

## 2015-01-01 DIAGNOSIS — Y9389 Activity, other specified: Secondary | ICD-10-CM | POA: Insufficient documentation

## 2015-01-01 DIAGNOSIS — K219 Gastro-esophageal reflux disease without esophagitis: Secondary | ICD-10-CM | POA: Insufficient documentation

## 2015-01-01 DIAGNOSIS — F17210 Nicotine dependence, cigarettes, uncomplicated: Secondary | ICD-10-CM | POA: Insufficient documentation

## 2015-01-01 HISTORY — PX: SHOULDER ARTHROSCOPY: SHX128

## 2015-01-01 SURGERY — ARTHROSCOPY, SHOULDER
Anesthesia: General | Site: Shoulder | Laterality: Right

## 2015-01-01 MED ORDER — ROPIVACAINE HCL 5 MG/ML IJ SOLN
INTRAMUSCULAR | Status: DC | PRN
  Administered 2015-01-01: 25 mL via PERINEURAL

## 2015-01-01 MED ORDER — FENTANYL CITRATE 0.05 MG/ML IJ SOLN
INTRAMUSCULAR | Status: DC | PRN
  Administered 2015-01-01 (×2): 50 ug via INTRAVENOUS

## 2015-01-01 MED ORDER — FENTANYL CITRATE 0.05 MG/ML IJ SOLN
50.0000 ug | INTRAMUSCULAR | Status: DC | PRN
  Administered 2015-01-01: 100 ug via INTRAVENOUS

## 2015-01-01 MED ORDER — MIDAZOLAM HCL 2 MG/2ML IJ SOLN
INTRAMUSCULAR | Status: AC
  Filled 2015-01-01: qty 2

## 2015-01-01 MED ORDER — MIDAZOLAM HCL 2 MG/2ML IJ SOLN
1.0000 mg | INTRAMUSCULAR | Status: DC | PRN
  Administered 2015-01-01: 2 mg via INTRAVENOUS

## 2015-01-01 MED ORDER — FENTANYL CITRATE 0.05 MG/ML IJ SOLN
INTRAMUSCULAR | Status: AC
  Administered 2015-01-01: 100 ug via INTRAVENOUS
  Filled 2015-01-01: qty 2

## 2015-01-01 MED ORDER — PROPOFOL 10 MG/ML IV BOLUS
INTRAVENOUS | Status: DC | PRN
  Administered 2015-01-01: 100 mg via INTRAVENOUS
  Administered 2015-01-01: 200 mg via INTRAVENOUS

## 2015-01-01 MED ORDER — FENTANYL CITRATE 0.05 MG/ML IJ SOLN
INTRAMUSCULAR | Status: AC
  Filled 2015-01-01: qty 5

## 2015-01-01 MED ORDER — MIDAZOLAM HCL 5 MG/5ML IJ SOLN
INTRAMUSCULAR | Status: DC | PRN
  Administered 2015-01-01: 2 mg via INTRAVENOUS

## 2015-01-01 MED ORDER — SUCCINYLCHOLINE CHLORIDE 20 MG/ML IJ SOLN
INTRAMUSCULAR | Status: DC | PRN
  Administered 2015-01-01: 100 mg via INTRAVENOUS

## 2015-01-01 MED ORDER — MIDAZOLAM HCL 2 MG/2ML IJ SOLN
INTRAMUSCULAR | Status: AC
  Administered 2015-01-01: 2 mg via INTRAVENOUS
  Filled 2015-01-01: qty 2

## 2015-01-01 MED ORDER — PROPOFOL 10 MG/ML IV BOLUS
INTRAVENOUS | Status: AC
  Filled 2015-01-01: qty 20

## 2015-01-01 MED ORDER — BUPIVACAINE HCL (PF) 0.25 % IJ SOLN
INTRAMUSCULAR | Status: DC | PRN
  Administered 2015-01-01: 14 mL

## 2015-01-01 MED ORDER — ONDANSETRON HCL 4 MG/2ML IJ SOLN
INTRAMUSCULAR | Status: DC | PRN
  Administered 2015-01-01: 4 mg via INTRAVENOUS

## 2015-01-01 MED ORDER — LACTATED RINGERS IV SOLN
INTRAVENOUS | Status: DC | PRN
  Administered 2015-01-01 (×2): via INTRAVENOUS

## 2015-01-01 MED ORDER — LACTATED RINGERS IV SOLN
INTRAVENOUS | Status: DC
  Administered 2015-01-01: 13:00:00 via INTRAVENOUS

## 2015-01-01 MED ORDER — SODIUM CHLORIDE 0.9 % IR SOLN
Status: DC | PRN
  Administered 2015-01-01: 6000 mL

## 2015-01-01 MED ORDER — PHENYLEPHRINE 40 MCG/ML (10ML) SYRINGE FOR IV PUSH (FOR BLOOD PRESSURE SUPPORT)
PREFILLED_SYRINGE | INTRAVENOUS | Status: AC
  Filled 2015-01-01: qty 10

## 2015-01-01 MED ORDER — OXYCODONE-ACETAMINOPHEN 5-325 MG PO TABS: 1.0000 | ORAL_TABLET | ORAL | Status: AC | PRN

## 2015-01-01 MED ORDER — BUPIVACAINE HCL (PF) 0.25 % IJ SOLN
INTRAMUSCULAR | Status: AC
  Filled 2015-01-01: qty 30

## 2015-01-01 MED ORDER — LIDOCAINE HCL (CARDIAC) 20 MG/ML IV SOLN
INTRAVENOUS | Status: DC | PRN
  Administered 2015-01-01: 100 mg via INTRAVENOUS

## 2015-01-01 MED ORDER — SODIUM CHLORIDE 0.9 % IR SOLN
Status: DC | PRN
  Administered 2015-01-01: 1000 mL

## 2015-01-01 SURGICAL SUPPLY — 36 items
BLADE GREAT WHITE 4.2 (BLADE) ×2 IMPLANT
BUR OVAL 6.0 (BURR) ×2 IMPLANT
CANNULA SHOULDER 7CM (CANNULA) ×4 IMPLANT
COVER SURGICAL LIGHT HANDLE (MISCELLANEOUS) ×2 IMPLANT
DRAPE IMP U-DRAPE 54X76 (DRAPES) IMPLANT
DRAPE INCISE IOBAN 66X45 STRL (DRAPES) ×2 IMPLANT
DRAPE ORTHO SPLIT 77X108 STRL (DRAPES) ×2
DRAPE STERI 35X30 U-POUCH (DRAPES) ×2 IMPLANT
DRAPE SURG ORHT 6 SPLT 77X108 (DRAPES) ×2 IMPLANT
DRAPE U-SHAPE 47X51 STRL (DRAPES) ×2 IMPLANT
DRSG EMULSION OIL 3X3 NADH (GAUZE/BANDAGES/DRESSINGS) ×2 IMPLANT
DRSG PAD ABDOMINAL 8X10 ST (GAUZE/BANDAGES/DRESSINGS) ×4 IMPLANT
DURAPREP 26ML APPLICATOR (WOUND CARE) ×2 IMPLANT
GAUZE SPONGE 4X4 12PLY STRL (GAUZE/BANDAGES/DRESSINGS) ×2 IMPLANT
GLOVE BIOGEL PI IND STRL 9 (GLOVE) ×1 IMPLANT
GLOVE BIOGEL PI INDICATOR 9 (GLOVE) ×1
GLOVE SURG ORTHO 9.0 STRL STRW (GLOVE) ×2 IMPLANT
GOWN STRL REUS W/ TWL XL LVL3 (GOWN DISPOSABLE) ×1 IMPLANT
GOWN STRL REUS W/TWL XL LVL3 (GOWN DISPOSABLE) ×1
KIT BASIN OR (CUSTOM PROCEDURE TRAY) ×2 IMPLANT
KIT ROOM TURNOVER OR (KITS) ×2 IMPLANT
MANIFOLD NEPTUNE II (INSTRUMENTS) ×2 IMPLANT
NEEDLE SPNL 18GX3.5 QUINCKE PK (NEEDLE) ×2 IMPLANT
NS IRRIG 1000ML POUR BTL (IV SOLUTION) ×2 IMPLANT
PACK SHOULDER (CUSTOM PROCEDURE TRAY) ×2 IMPLANT
PACK UNIVERSAL I (CUSTOM PROCEDURE TRAY) ×2 IMPLANT
PAD ARMBOARD 7.5X6 YLW CONV (MISCELLANEOUS) ×4 IMPLANT
SET ARTHROSCOPY TUBING (MISCELLANEOUS) ×1
SET ARTHROSCOPY TUBING LN (MISCELLANEOUS) ×1 IMPLANT
SLING ARM IMMOBILIZER XL (CAST SUPPLIES) IMPLANT
SPONGE LAP 4X18 X RAY DECT (DISPOSABLE) IMPLANT
SUT ETHILON 2 0 FS 18 (SUTURE) ×2 IMPLANT
TAPE CLOTH SURG 6X10 WHT LF (GAUZE/BANDAGES/DRESSINGS) ×2 IMPLANT
TOWEL OR 17X24 6PK STRL BLUE (TOWEL DISPOSABLE) ×2 IMPLANT
WAND HAND CNTRL MULTIVAC 90 (MISCELLANEOUS) ×2 IMPLANT
WATER STERILE IRR 1000ML POUR (IV SOLUTION) ×2 IMPLANT

## 2015-01-01 NOTE — Anesthesia Postprocedure Evaluation (Signed)
  Anesthesia Post-op Note  Patient: Brett Stokes  Procedure(s) Performed: Procedure(s) (LRB): ARTHROSCOPY SHOULDER (Right)  Patient Location: PACU  Anesthesia Type: GA combined with regional for post-op pain  Level of Consciousness: awake and alert   Airway and Oxygen Therapy: Patient Spontanous Breathing  Post-op Pain: mild  Post-op Assessment: Post-op Vital signs reviewed, Patient's Cardiovascular Status Stable, Respiratory Function Stable, Patent Airway and No signs of Nausea or vomiting  Last Vitals:  Filed Vitals:   01/01/15 1437  BP: 113/72  Pulse: 75  Temp:   Resp: 24    Post-op Vital Signs: stable   Complications: No apparent anesthesia complications

## 2015-01-01 NOTE — Transfer of Care (Signed)
Immediate Anesthesia Transfer of Care Note  Patient: Brett Stokes  Procedure(s) Performed: Procedure(s): ARTHROSCOPY SHOULDER (Right)  Patient Location: PACU  Anesthesia Type:General  Level of Consciousness: awake, alert  and oriented  Airway & Oxygen Therapy: Patient Spontanous Breathing and Patient connected to nasal cannula oxygen  Post-op Assessment: Report given to RN, Post -op Vital signs reviewed and stable and Patient moving all extremities  Post vital signs: Reviewed and stable  Last Vitals:  Filed Vitals:   01/01/15 1434  BP:   Pulse:   Temp: 36.4 C  Resp:     Complications: No apparent anesthesia complications, ISB working well

## 2015-01-01 NOTE — Anesthesia Procedure Notes (Addendum)
Anesthesia Regional Block:  Interscalene brachial plexus block  Pre-Anesthetic Checklist: ,, timeout performed, Correct Patient, Correct Site, Correct Laterality, Correct Procedure, Correct Position, site marked, Risks and benefits discussed,  Surgical consent,  Pre-op evaluation,  At surgeon's request and post-op pain management  Laterality: Right  Prep: chloraprep       Needles:  Injection technique: Single-shot  Needle Type: Echogenic Stimulator Needle     Needle Length: 9cm 9 cm Needle Gauge: 21 and 21 G    Additional Needles:  Procedures: ultrasound guided (picture in chart) Interscalene brachial plexus block Narrative:  Injection made incrementally with aspirations every 5 mL.  Performed by: Personally  Anesthesiologist: Najiyah Paris, Greggory StallionGEORGE  Additional Notes: Patient tolerated the procedure well without complications   Procedure Name: Intubation Date/Time: 01/01/2015 1:33 PM Performed by: Orvilla FusATO, SARAH A Pre-anesthesia Checklist: Patient identified, Timeout performed, Emergency Drugs available, Suction available and Patient being monitored Patient Re-evaluated:Patient Re-evaluated prior to inductionOxygen Delivery Method: Circle system utilized Preoxygenation: Pre-oxygenation with 100% oxygen Intubation Type: IV induction Ventilation: Mask ventilation without difficulty Laryngoscope Size: Mac and 3 Grade View: Grade I Tube type: Oral Tube size: 7.5 mm Number of attempts: 1 Airway Equipment and Method: Stylet Placement Confirmation: ETT inserted through vocal cords under direct vision,  breath sounds checked- equal and bilateral and positive ETCO2 Secured at: 23 cm Tube secured with: Tape Dental Injury: Teeth and Oropharynx as per pre-operative assessment

## 2015-01-01 NOTE — Discharge Instructions (Signed)
Start ROM right shoulder tomorrow, D/C sling tomorrow, D/C dressing in 2 days

## 2015-01-01 NOTE — H&P (Signed)
Brett Stokes is an 39 y.o. male.   Chief Complaint: Right shoulder pain with rotator cuff tear and impingement syndrome HPI: Patient is a 39 year old gentleman who has failed conservative care with his right shoulder. Patient has rotator cuff tear with impingement syndrome and presents at this time for arthroscopic debridement.  Past Medical History  Diagnosis Date  . Anxiety     never been treated  . GERD (gastroesophageal reflux disease)   . Headache   . Arthritis   . Constipation     Past Surgical History  Procedure Laterality Date  . Back surgery    . Cholecystectomy    . Appendectomy      Family History  Problem Relation Age of Onset  . Cancer Father   . Stroke Father   . Diabetes Maternal Grandmother    Social History:  reports that he has been smoking Cigarettes.  He has a 20 pack-year smoking history. He has quit using smokeless tobacco. He reports that he drinks alcohol. He reports that he does not use illicit drugs.  Allergies: No Known Allergies  No prescriptions prior to admission    No results found for this or any previous visit (from the past 48 hour(s)). No results found.  Review of Systems  All other systems reviewed and are negative.   There were no vitals taken for this visit. Physical Exam  On examination patient has pain with Neer and Hawkins impingement test pain with drop arm test. Assessment/Plan Assessment: Rotator cuff tear right shoulder with impingement syndrome.  Plan we will plan for a right shoulder arthroscopy with debridement of rotator cuff tear subacromial decompression. Risks and benefits of surgery were discussed patient states he understands and wishes to proceed at this time.  DUDA,MARCUS V 01/01/2015, 6:51 AM

## 2015-01-01 NOTE — Op Note (Signed)
01/01/2015  7:14 PM  PATIENT:  Brett Stokes    PRE-OPERATIVE DIAGNOSIS:  Right Shoulder Impingement  POST-OPERATIVE DIAGNOSIS:  Same  PROCEDURE:  ARTHROSCOPY SHOULDER  SURGEON:  Freyja Govea V, MD  PHYSICIAN ASSISTANT:None ANESTHESIA:   General  PREOPERATIVE INDICATIONS:  Brett Stokes is a  39 y.o. male with a diagnosis of Right Shoulder Impingement who failed conservative measures and elected for surgical management.    The risks benefits and alternatives were discussed with the patient preoperatively including but not limited to the risks of infection, bleeding, nerve injury, cardiopulmonary complications, the need for revision surgery, among others, and the patient was willing to proceed.  OPERATIVE IMPLANTS: None  OPERATIVE FINDINGS: Hooked type III acromion with rotator cuff tear and SLAP lesion  OPERATIVE PROCEDURE: Patient was brought to the operating room and underwent a general anesthetic. After adequate levels of anesthesia were obtained patient was placed in the beachchair position and the right upper extremity was prepped using DuraPrep draped into a sterile field. A timeout was called. The scope was inserted from the posterior portal and anterior portal was established with outside in technique with an 18-gauge spinal. The anterior portal was used for a working portal. Visualization showed a grade 1 slap lesion which was debrided. Patient had partial tearing of the rotator cuff which was debrided internally. The articular cartilage was in good condition. The enhancements removed the scope was then inserted from the posterior portal and subacromial space and a new lateral portal was established. Visualization showed a hooked type III acromion again with more partial tearing of the rotator cuff. The rotator cuff was further debrided patient underwent subacromial decompression. The enhancements were removed the portals were closed using 3-0 nylon. A sterile dressing was applied.  Patient was extubated taken to the PACU in stable condition.

## 2015-01-01 NOTE — Anesthesia Preprocedure Evaluation (Signed)
Anesthesia Evaluation  Patient identified by MRN, date of birth, ID band Patient awake    Reviewed: Allergy & Precautions, NPO status , Patient's Chart, lab work & pertinent test results  Airway Mallampati: II  TM Distance: >3 FB Neck ROM: Full    Dental no notable dental hx.    Pulmonary Current Smoker,  breath sounds clear to auscultation  Pulmonary exam normal       Cardiovascular negative cardio ROS  Rhythm:Regular Rate:Normal     Neuro/Psych negative neurological ROS  negative psych ROS   GI/Hepatic Neg liver ROS, GERD-  ,  Endo/Other  negative endocrine ROS  Renal/GU negative Renal ROS  negative genitourinary   Musculoskeletal negative musculoskeletal ROS (+)   Abdominal   Peds negative pediatric ROS (+)  Hematology negative hematology ROS (+)   Anesthesia Other Findings   Reproductive/Obstetrics negative OB ROS                             Anesthesia Physical Anesthesia Plan  ASA: II  Anesthesia Plan: General   Post-op Pain Management:    Induction: Intravenous  Airway Management Planned: Oral ETT  Additional Equipment:   Intra-op Plan:   Post-operative Plan: Extubation in OR  Informed Consent: I have reviewed the patients History and Physical, chart, labs and discussed the procedure including the risks, benefits and alternatives for the proposed anesthesia with the patient or authorized representative who has indicated his/her understanding and acceptance.   Dental advisory given  Plan Discussed with: CRNA and Surgeon  Anesthesia Plan Comments:         Anesthesia Quick Evaluation

## 2015-01-04 ENCOUNTER — Telehealth: Payer: Self-pay | Admitting: Internal Medicine

## 2015-01-04 ENCOUNTER — Encounter (HOSPITAL_COMMUNITY): Payer: Self-pay | Admitting: Orthopedic Surgery

## 2015-01-04 NOTE — Telephone Encounter (Signed)
Pt requesting referral to neurology due to neck, back and shoulder pain Orthopedic recommended to go to neurology

## 2015-01-21 ENCOUNTER — Encounter: Payer: Self-pay | Admitting: Internal Medicine

## 2015-01-21 ENCOUNTER — Ambulatory Visit: Payer: No Typology Code available for payment source | Attending: Internal Medicine | Admitting: Internal Medicine

## 2015-01-21 VITALS — BP 123/79 | HR 69 | Temp 98.0°F | Resp 16 | Wt 223.0 lb

## 2015-01-21 DIAGNOSIS — Z9889 Other specified postprocedural states: Secondary | ICD-10-CM

## 2015-01-21 DIAGNOSIS — M545 Low back pain: Secondary | ICD-10-CM

## 2015-01-21 DIAGNOSIS — M542 Cervicalgia: Secondary | ICD-10-CM

## 2015-01-21 DIAGNOSIS — G8929 Other chronic pain: Secondary | ICD-10-CM

## 2015-01-21 DIAGNOSIS — M25511 Pain in right shoulder: Secondary | ICD-10-CM

## 2015-01-21 NOTE — Progress Notes (Signed)
MRN: [id number] Name: Brett Stokes  Sex: male Age: 39 y.o. DOB:[id number] Aug 25, 1976  Allergies: Review of patient's allergies indicates no known allergies.  Chief Complaint  Patient presents with  . Follow-up    HPI: Patient is 39 y.o. male who has history of chronic neck pain, low back pain, as per patient he had blood sugar surgery 3 weeks ago and has been following up with orthopedics, for the neck and lower back pain he was advised to get an MRI done, as per patient he had a surgery done in his back but recently has been having worsening symptoms sometimes there is shooting pain down to the leg, denies any incontinence.  Past Medical History  Diagnosis Date  . Anxiety     never been treated  . GERD (gastroesophageal reflux disease)   . Headache   . Arthritis   . Constipation     Past Surgical History  Procedure Laterality Date  . Back surgery    . Cholecystectomy    . Appendectomy    . Shoulder arthroscopy Right 01/01/2015    Procedure: ARTHROSCOPY SHOULDER;  Surgeon: Nadara MustardMarcus Duda V, MD;  [facility] OR;  Service: Orthopedics;  Laterality: Right;      Medication List       This list is accurate as of: 01/21/15  5:58 PM.  Always use your most recent med list.               Aspirin-Acetaminophen 500-325 MG Pack  Take by mouth.     buPROPion 100 MG 12 hr tablet  Commonly known as:  WELLBUTRIN SR  Take 1 tablet (100 mg total) by mouth 2 (two) times daily.     ibuprofen 800 MG tablet  Commonly known as:  ADVIL,MOTRIN  Take 1 tablet (800 mg total) by mouth every 8 (eight) hours as needed. Take with food.     methocarbamol 500 MG tablet  Commonly known as:  ROBAXIN  Take 1 tablet (500 mg total) by mouth 2 (two) times daily.     ondansetron 4 MG disintegrating tablet  Commonly known as:  ZOFRAN ODT  Take 1 tablet (4 mg total) by mouth every 8 (eight) hours as needed for nausea or vomiting.     oxyCODONE-acetaminophen 5-325 MG per tablet  Commonly known as:   ROXICET  Take 1 tablet by mouth every 4 (four) hours as needed for severe pain.        No orders of the defined types were placed in this encounter.    Immunization History  Administered Date(s) Administered  . Pneumococcal Polysaccharide-23 08/24/2014    Family History  Problem Relation Age of Onset  . Cancer Father   . Stroke Father   . Diabetes Maternal Grandmother     History  Substance Use Topics  . Smoking status: Current Every Day Smoker -- 1.00 packs/day for 20 years    Types: Cigarettes  . Smokeless tobacco: Former NeurosurgeonUser  . Alcohol Use: 0.0 oz/week    0 Standard drinks or equivalent per week     Comment: socially     Review of Systems   As noted in HPI  Filed Vitals:   01/21/15 1420  BP: 123/79  Pulse: 69  Temp: 98 F (36.7 C)  Resp: 16    Physical Exam  Physical Exam  Constitutional: No distress.  Cardiovascular: Normal rate and regular rhythm.   Pulmonary/Chest: Breath sounds normal. No respiratory distress. He has no wheezes. He has no  rales.  Musculoskeletal:  , lower lumbar paraspinal tenderness, equal strength both lower extremities, with SLR test patient is complaining of back discomfort    CBC    Component Value Date/Time   WBC 8.3 12/22/2014 1455   RBC 4.64 12/22/2014 1455   HGB 15.3 12/22/2014 1455   HCT 44.3 12/22/2014 1455   PLT 214 12/22/2014 1455   MCV 95.5 12/22/2014 1455   LYMPHSABS 2.4 08/24/2014 1155   MONOABS 0.6 08/24/2014 1155   EOSABS 0.2 08/24/2014 1155   BASOSABS 0.0 08/24/2014 1155    CMP     Component Value Date/Time   NA 137 12/22/2014 1455   K 3.9 12/22/2014 1455   CL 105 12/22/2014 1455   CO2 23 12/22/2014 1455   GLUCOSE 96 12/22/2014 1455   BUN 11 12/22/2014 1455   CREATININE 0.89 12/22/2014 1455   CREATININE 0.93 08/24/2014 1155   CALCIUM 9.5 12/22/2014 1455   PROT 7.3 12/22/2014 1455   ALBUMIN 4.4 12/22/2014 1455   AST 26 12/22/2014 1455   ALT 22 12/22/2014 1455   ALKPHOS 55 12/22/2014 1455     BILITOT 0.7 12/22/2014 1455   GFRNONAA >90 12/22/2014 1455   GFRNONAA >89 08/24/2014 1155   GFRAA >90 12/22/2014 1455   GFRAA >89 08/24/2014 1155    No results found for: CHOL  Lab Results  Component Value Date/Time   HGBA1C 5.2 08/24/2014 11:55 AM    Lab Results  Component Value Date/Time   AST 26 12/22/2014 02:55 PM    Assessment and Plan  Chronic lower back pain - Plan: MR Lumbar Spine W Wo Contrast  History of back surgery - Plan: MR Lumbar Spine W Wo Contrast  Right shoulder pain Patient had arthroscopic done and currently following up with orthopedics.  Cervicalgia - Plan: MR Cervical Spine Wo Contrast    Return in about 3 months (around 04/22/2015), or if symptoms worsen or fail to improve.   This note has been created with Education officer, environmental. Any transcriptional errors are unintentional.    Doris Cheadle, MD

## 2015-01-21 NOTE — Progress Notes (Signed)
Patient here for follow up on his chronic back pain Patient did have surgery on his right shoulder 4/1 and follows up with orthopedics

## 2015-02-15 ENCOUNTER — Ambulatory Visit (HOSPITAL_COMMUNITY): Payer: Self-pay

## 2015-02-15 ENCOUNTER — Ambulatory Visit (HOSPITAL_COMMUNITY)
Admission: RE | Admit: 2015-02-15 | Discharge: 2015-02-15 | Disposition: A | Discharge status: Home / Self Care | Payer: Self-pay | Source: Ambulatory Visit | Attending: Internal Medicine | Admitting: Internal Medicine

## 2015-02-15 DIAGNOSIS — G8929 Other chronic pain: Secondary | ICD-10-CM | POA: Insufficient documentation

## 2015-02-15 DIAGNOSIS — M545 Low back pain, unspecified: Secondary | ICD-10-CM

## 2015-02-15 DIAGNOSIS — M4802 Spinal stenosis, cervical region: Secondary | ICD-10-CM | POA: Insufficient documentation

## 2015-02-15 DIAGNOSIS — M5126 Other intervertebral disc displacement, lumbar region: Secondary | ICD-10-CM | POA: Insufficient documentation

## 2015-02-15 DIAGNOSIS — M542 Cervicalgia: Secondary | ICD-10-CM

## 2015-02-15 DIAGNOSIS — Z9889 Other specified postprocedural states: Secondary | ICD-10-CM

## 2015-02-15 DIAGNOSIS — M5022 Other cervical disc displacement, mid-cervical region: Secondary | ICD-10-CM | POA: Insufficient documentation

## 2015-02-15 DIAGNOSIS — M4806 Spinal stenosis, lumbar region: Secondary | ICD-10-CM | POA: Insufficient documentation

## 2015-02-15 MED ORDER — GADOBENATE DIMEGLUMINE 529 MG/ML IV SOLN
20.0000 mL | Freq: Once | INTRAVENOUS | Status: AC | PRN
  Administered 2015-02-15: 20 mL via INTRAVENOUS

## 2015-02-19 ENCOUNTER — Telehealth: Payer: Self-pay

## 2015-02-19 NOTE — Telephone Encounter (Signed)
Patient not available Message left on voice mail to return our call 

## 2015-02-19 NOTE — Telephone Encounter (Signed)
-----   Message from Doris Cheadleeepak Advani, MD sent at 02/16/2015  9:40 AM EDT ----- Call and let the patient know that he  has abnormal cervical and lumbar MRI with disc degeneration, spinal stenosis  and disc herniation, needs further evaluation by neurosurgery, put in the referral.

## 2015-02-19 NOTE — Telephone Encounter (Signed)
Gave patient results.  Patient had no further questions and appreciative of call.

## 2015-02-23 ENCOUNTER — Other Ambulatory Visit (HOSPITAL_COMMUNITY): Payer: Self-pay

## 2015-02-23 ENCOUNTER — Ambulatory Visit (HOSPITAL_COMMUNITY): Admission: RE | Admit: 2015-02-23 | Payer: No Typology Code available for payment source | Source: Ambulatory Visit

## 2015-04-07 ENCOUNTER — Encounter (HOSPITAL_COMMUNITY): Payer: Self-pay | Admitting: *Deleted

## 2015-04-07 ENCOUNTER — Emergency Department (HOSPITAL_COMMUNITY)
Admission: EM | Admit: 2015-04-07 | Discharge: 2015-04-07 | Disposition: A | Discharge status: Home / Self Care | Payer: Self-pay | Attending: Emergency Medicine | Admitting: Emergency Medicine

## 2015-04-07 DIAGNOSIS — Z720 Tobacco use: Secondary | ICD-10-CM | POA: Insufficient documentation

## 2015-04-07 DIAGNOSIS — M545 Low back pain: Secondary | ICD-10-CM | POA: Insufficient documentation

## 2015-04-07 DIAGNOSIS — Z8659 Personal history of other mental and behavioral disorders: Secondary | ICD-10-CM | POA: Insufficient documentation

## 2015-04-07 DIAGNOSIS — M1990 Unspecified osteoarthritis, unspecified site: Secondary | ICD-10-CM | POA: Insufficient documentation

## 2015-04-07 DIAGNOSIS — Z791 Long term (current) use of non-steroidal anti-inflammatories (NSAID): Secondary | ICD-10-CM | POA: Insufficient documentation

## 2015-04-07 DIAGNOSIS — G8929 Other chronic pain: Secondary | ICD-10-CM | POA: Insufficient documentation

## 2015-04-07 DIAGNOSIS — Z8719 Personal history of other diseases of the digestive system: Secondary | ICD-10-CM | POA: Insufficient documentation

## 2015-04-07 DIAGNOSIS — Z79899 Other long term (current) drug therapy: Secondary | ICD-10-CM | POA: Insufficient documentation

## 2015-04-07 MED ORDER — CYCLOBENZAPRINE HCL 10 MG PO TABS
10.0000 mg | ORAL_TABLET | Freq: Once | ORAL | Status: AC
  Administered 2015-04-07: 10 mg via ORAL
  Filled 2015-04-07: qty 1

## 2015-04-07 MED ORDER — OXYCODONE-ACETAMINOPHEN 5-325 MG PO TABS
1.0000 | ORAL_TABLET | Freq: Once | ORAL | Status: AC
  Administered 2015-04-07: 1 via ORAL
  Filled 2015-04-07: qty 1

## 2015-04-07 MED ORDER — DICLOFENAC SODIUM 75 MG PO TBEC: 75.0000 mg | DELAYED_RELEASE_TABLET | Freq: Two times a day (BID) | ORAL | Status: AC

## 2015-04-07 MED ORDER — CYCLOBENZAPRINE HCL 10 MG PO TABS
10.0000 mg | ORAL_TABLET | Freq: Two times a day (BID) | ORAL | Status: DC | PRN
Start: 2015-04-07 — End: 2023-03-13

## 2015-04-07 NOTE — ED Provider Notes (Signed)
CSN: [id number]     Arrival date & time 04/07/15  1825 History  This chart was scribed for non-physician practitioner, [facility] M. Damian Leavell, NP, working with Benjiman Core, MD, by Budd Palmer ED Scribe. This patient was seen in room TR03C/TR03C and the patient's care was started at 6:56 PM    Chief Complaint  Patient presents with  . Back Pain   Patient is a 39 y.o. male presenting with back pain. The history is provided by the patient. No language interpreter was used.  Back Pain Location:  Lumbar spine Quality:  Aching Pain severity:  Moderate Pain is:  Unable to specify Onset quality:  Gradual Timing:  Constant Progression:  Worsening Chronicity:  Chronic Context: recent injury   Relieved by:  NSAIDs Associated symptoms: no fever     HPI Comments: Brett Stokes is a 39 y.o. male with a PSHx of back surgery (2008, L4 and L5 for deteriorating disks), who presents to the Emergency Department complaining of chronic lower back pain onset 2015. He says today his pain is mostly in his lower back and neck. He reports bloody stool stating that once it was bright red, otherwise he saw only spots of blood. He also reports occasional straining. He fell in September of 2015, and has had lower back pain since then. He had an MRI done in May. He was referred to a specialist, but never heard back from them. He does not know where to go or what to do. He has not gone back to see his PCP at [facility] because his "orange card" has expired. He has been taking ibuprofen and BCs for the pain with mild relief. He denies constipation. He has a PSHx on the shoulder, as well as cholecystectomy and appendectomy.   Past Medical History  Diagnosis Date  . Anxiety     never been treated  . GERD (gastroesophageal reflux disease)   . Headache   . Arthritis   . Constipation    Past Surgical History  Procedure Laterality Date  . Back surgery    . Cholecystectomy    . Appendectomy    . Shoulder  arthroscopy Right 01/01/2015    Procedure: ARTHROSCOPY SHOULDER;  Surgeon: Nadara Mustard, MD;  Location: [facility] OR;  Service: Orthopedics;  Laterality: Right;   Family History  Problem Relation Age of Onset  . Cancer Father   . Stroke Father   . Diabetes Maternal Grandmother    History  Substance Use Topics  . Smoking status: Current Every Day Smoker -- 1.00 packs/day for 20 years    Types: Cigarettes  . Smokeless tobacco: Former Neurosurgeon  . Alcohol Use: 0.0 oz/week    0 Standard drinks or equivalent per week     Comment: socially     Review of Systems  Constitutional: Negative for fever.  Gastrointestinal: Positive for blood in stool. Negative for constipation.  Musculoskeletal: Positive for myalgias and back pain.  All other systems reviewed and are negative.   Allergies  Review of patient's allergies indicates no known allergies.  Home Medications   Prior to Admission medications   Medication Sig Start Date End Date Taking? Authorizing Provider  Aspirin-Acetaminophen 500-325 MG PACK Take by mouth.    Historical Provider, MD  buPROPion (WELLBUTRIN SR) 100 MG 12 hr tablet Take 1 tablet (100 mg total) by mouth 2 (two) times daily. Patient not taking: Reported on 12/21/2014 08/24/14   Doris Cheadle, MD  cyclobenzaprine (FLEXERIL) 10 MG tablet Take  1 tablet (10 mg total) by mouth 2 (two) times daily as needed for muscle spasms. 04/07/15   Luka Reisch Orlene Och, NP  diclofenac (VOLTAREN) 75 MG EC tablet Take 1 tablet (75 mg total) by mouth 2 (two) times daily. 04/07/15   Caitlin Ainley Orlene Och, NP  ibuprofen (ADVIL,MOTRIN) 800 MG tablet Take 1 tablet (800 mg total) by mouth every 8 (eight) hours as needed. Take with food. Patient not taking: Reported on 12/21/2014 08/24/14   Doris Cheadle, MD  methocarbamol (ROBAXIN) 500 MG tablet Take 1 tablet (500 mg total) by mouth 2 (two) times daily. Patient not taking: Reported on 12/21/2014 09/19/14   Victorino Dike Piepenbrink, PA-C  ondansetron (ZOFRAN ODT) 4 MG disintegrating  tablet Take 1 tablet (4 mg total) by mouth every 8 (eight) hours as needed for nausea or vomiting. Patient not taking: Reported on 12/21/2014 09/19/14   Francee Piccolo, PA-C  oxyCODONE-acetaminophen (ROXICET) 5-325 MG per tablet Take 1 tablet by mouth every 4 (four) hours as needed for severe pain. 01/01/15   Nadara Mustard, MD   BP 127/76 mmHg  Pulse 75  Temp(Src) 98.2 F (36.8 C) (Oral)  Resp 20  SpO2 97% Physical Exam  Constitutional: He is oriented to person, place, and time. He appears well-developed and well-nourished. No distress.  HENT:  Head: Normocephalic and atraumatic.  Mouth/Throat: Oropharynx is clear and moist.  Eyes: Conjunctivae and EOM are normal. Pupils are equal, round, and reactive to light.  Neck: Normal range of motion. Neck supple. No tracheal deviation present.  Cardiovascular: Normal rate and regular rhythm.   Pulmonary/Chest: Effort normal and breath sounds normal. No respiratory distress. He has no wheezes. He has no rales.  Abdominal: Soft. Bowel sounds are normal. There is no tenderness.  Musculoskeletal: Normal range of motion. He exhibits no edema.       Lumbar back: He exhibits tenderness and spasm. He exhibits normal range of motion, no deformity and normal pulse.       Back:  Neurological: He is alert and oriented to person, place, and time. He has normal strength. No cranial nerve deficit or sensory deficit. Coordination and gait normal.  Reflex Scores:      Bicep reflexes are 2+ on the right side and 2+ on the left side.      Brachioradialis reflexes are 2+ on the right side and 2+ on the left side.      Patellar reflexes are 2+ on the right side and 2+ on the left side.      Achilles reflexes are 2+ on the right side and 2+ on the left side. Skin: Skin is warm and dry.  Psychiatric: He has a normal mood and affect. His behavior is normal.  Nursing note and vitals reviewed.   ED Course  Procedures  DIAGNOSTIC STUDIES: Oxygen Saturation is  97% on RA, normal by my interpretation.    COORDINATION OF CARE: 7:08 PM - Discussed plans to order pain medication. Will refer to specialist. Advised to follow up with PCP. Pt advised of plan for treatment and pt agrees.   MDM  39 y.o. male with chronic low back pain and awaiting appointment with spine center stable for d/c without focal neuro deficits. Instructed to follow up with his doctor at [facility]. He agrees with plan.   Final diagnoses:  Chronic low back pain   I personally performed the services described in this documentation, which was scribed in my presence. The recorded information has been reviewed and  is accurate.    LaytonHope M Aydin Cavalieri, NP 04/08/15 1244  Benjiman CoreNathan Pickering, MD 04/10/15 [phone]

## 2015-04-07 NOTE — ED Notes (Signed)
Pt verbalizes understanding of d/c instructions and denies any further needs at this time. 

## 2015-04-07 NOTE — ED Notes (Signed)
Pt c/o back and neck pain since September 2015. States he has had MRI and was referred to a spine specialist but cannot afford to see them.

## 2015-04-07 NOTE — ED Notes (Signed)
NP at bedside.

## 2019-02-19 ENCOUNTER — Emergency Department (HOSPITAL_COMMUNITY)
Admission: EM | Admit: 2019-02-19 | Discharge: 2019-02-19 | Disposition: A | Discharge status: Home / Self Care | Payer: Self-pay | Attending: Emergency Medicine | Admitting: Emergency Medicine

## 2019-02-19 ENCOUNTER — Encounter (HOSPITAL_COMMUNITY): Payer: Self-pay

## 2019-02-19 ENCOUNTER — Other Ambulatory Visit: Payer: Self-pay

## 2019-02-19 DIAGNOSIS — M67911 Unspecified disorder of synovium and tendon, right shoulder: Secondary | ICD-10-CM | POA: Insufficient documentation

## 2019-02-19 DIAGNOSIS — Z79899 Other long term (current) drug therapy: Secondary | ICD-10-CM | POA: Insufficient documentation

## 2019-02-19 DIAGNOSIS — F17210 Nicotine dependence, cigarettes, uncomplicated: Secondary | ICD-10-CM | POA: Insufficient documentation

## 2019-02-19 DIAGNOSIS — M5432 Sciatica, left side: Secondary | ICD-10-CM | POA: Insufficient documentation

## 2019-02-19 MED ORDER — METHOCARBAMOL 750 MG PO TABS: 750.0000 mg | ORAL_TABLET | Freq: Four times a day (QID) | ORAL | 0 refills | Status: AC

## 2019-02-19 MED ORDER — PREDNISONE 10 MG (21) PO TBPK: ORAL_TABLET | Freq: Every day | ORAL | 0 refills | Status: AC

## 2019-02-19 NOTE — ED Triage Notes (Signed)
Patient c/o left lower back pain that radiates into the left leg x months. Patient also c/o right neck and shoulder pain that radiates into the right arm x 1 month.

## 2019-02-19 NOTE — ED Notes (Signed)
Patient given discharge teaching and verbalized understanding. Patient ambulated out of ED with a steady gait. 

## 2019-02-19 NOTE — ED Notes (Signed)
Patient ambulated from triage to room 20.  

## 2019-02-19 NOTE — ED Provider Notes (Signed)
[facility]-EMERGENCY DEPT Provider Note   CSN: [id number] Arrival date & time: 02/19/19  6244    History   Chief Complaint Chief Complaint  Patient presents with  . Back Pain  . Neck Pain    HPI Brett Stokes is a 43 y.o. male.     This is a 43 year old male presents with a longstanding history right shoulder and left lower back pain time several months.  Has had no history of sciatica and has not been self medicate this time.  Denies any bowel or bladder dysfunction.  No urinary issues.  Pain is worse with standing and characterizes sharp and better with remaining still.  Denies any foot drop.  Has another complaint of having right shoulder pain localized to his right anterior shoulder that is worse with movement.  Denies any wrist drop.  He has no follow-up.     Past Medical History:  Diagnosis Date  . Anxiety    never been treated  . Arthritis   . Constipation   . GERD (gastroesophageal reflux disease)   . Headache     Patient Active Problem List   Diagnosis Date Noted  . History of back surgery 11/09/2014  . Smoking 08/24/2014  . Family history of diabetes mellitus (DM) 08/24/2014  . Right shoulder pain 08/24/2014    Past Surgical History:  Procedure Laterality Date  . APPENDECTOMY    . BACK SURGERY    . CHOLECYSTECTOMY    . SHOULDER ARTHROSCOPY Right 01/01/2015   Procedure: ARTHROSCOPY SHOULDER;  Surgeon: Nadara Mustard, MD;  Location: [facility] OR;  Service: Orthopedics;  Laterality: Right;        Home Medications    Prior to Admission medications   Medication Sig Start Date End Date Taking? Authorizing Provider  Aspirin-Acetaminophen 500-325 MG PACK Take by mouth.    [provider]  buPROPion (WELLBUTRIN SR) 100 MG 12 hr tablet Take 1 tablet (100 mg total) by mouth 2 (two) times daily. Patient not taking: Reported on 12/21/2014 08/24/14   Doris Cheadle, MD  cyclobenzaprine (FLEXERIL) 10 MG tablet Take 1 tablet (10 mg total)  by mouth 2 (two) times daily as needed for muscle spasms. 04/07/15   Janne Napoleon, NP  diclofenac (VOLTAREN) 75 MG EC tablet Take 1 tablet (75 mg total) by mouth 2 (two) times daily. 04/07/15   Janne Napoleon, NP  ibuprofen (ADVIL,MOTRIN) 800 MG tablet Take 1 tablet (800 mg total) by mouth every 8 (eight) hours as needed. Take with food. Patient not taking: Reported on 12/21/2014 08/24/14   Doris Cheadle, MD  methocarbamol (ROBAXIN) 500 MG tablet Take 1 tablet (500 mg total) by mouth 2 (two) times daily. Patient not taking: Reported on 12/21/2014 09/19/14   Piepenbrink, Victorino Dike, PA-C  ondansetron (ZOFRAN ODT) 4 MG disintegrating tablet Take 1 tablet (4 mg total) by mouth every 8 (eight) hours as needed for nausea or vomiting. Patient not taking: Reported on 12/21/2014 09/19/14   Piepenbrink, Victorino Dike, PA-C  oxyCODONE-acetaminophen (ROXICET) 5-325 MG per tablet Take 1 tablet by mouth every 4 (four) hours as needed for severe pain. 01/01/15   Nadara Mustard, MD    Family History Family History  Problem Relation Age of Onset  . Cancer Father   . Stroke Father   . Diabetes Maternal Grandmother     Social History Social History   Tobacco Use  . Smoking status: Current Every Day Smoker    Packs/day: 1.00    Years: 20.00  Pack years: 20.00    Types: Cigarettes  . Smokeless tobacco: Former Engineer, water Use Topics  . Alcohol use: Yes    Alcohol/week: 0.0 standard drinks    Comment: socially   . Drug use: Yes    Types: Marijuana, Cocaine     Allergies   Patient has no known allergies.   Review of Systems Review of Systems  All other systems reviewed and are negative.    Physical Exam Updated Vital Signs BP 120/74   Pulse 81   Temp 98.4 F (36.9 C) (Oral)   Resp 16   Ht 1.854 m ( )   Wt 96.2 kg   SpO2 95%   BMI 27.97 kg/m   Physical Exam Vitals signs and nursing note reviewed.  Constitutional:      General: He is not in acute distress.    Appearance: Normal  appearance. He is well-developed. He is not toxic-appearing.  HENT:     Head: Normocephalic and atraumatic.  Eyes:     General: Lids are normal.     Conjunctiva/sclera: Conjunctivae normal.     Pupils: Pupils are equal, round, and reactive to light.  Neck:     Musculoskeletal: Normal range of motion and neck supple.     Thyroid: No thyroid mass.     Trachea: No tracheal deviation.  Cardiovascular:     Rate and Rhythm: Normal rate and regular rhythm.     Heart sounds: Normal heart sounds. No murmur. No gallop.   Pulmonary:     Effort: Pulmonary effort is normal. No respiratory distress.     Breath sounds: Normal breath sounds. No stridor. No decreased breath sounds, wheezing, rhonchi or rales.  Abdominal:     General: Bowel sounds are normal. There is no distension.     Palpations: Abdomen is soft.     Tenderness: There is no abdominal tenderness. There is no rebound.  Musculoskeletal: Normal range of motion.     Right shoulder: He exhibits tenderness. He exhibits no swelling and no effusion.       Back:  Skin:    General: Skin is warm and dry.     Findings: No abrasion or rash.  Neurological:     Mental Status: He is alert and oriented to person, place, and time.     GCS: GCS eye subscore is 4. GCS verbal subscore is 5. GCS motor subscore is 6.     Cranial Nerves: No cranial nerve deficit.     Sensory: No sensory deficit.     Motor: No weakness or atrophy.     Deep Tendon Reflexes:     Reflex Scores:      Patellar reflexes are 1+ on the right side and 1+ on the left side. Psychiatric:        Speech: Speech normal.        Behavior: Behavior normal.      ED Treatments / Results  Labs (all labs ordered are listed, but only abnormal results are displayed) Labs Reviewed - No data to display  EKG None  Radiology No results found.  Procedures Procedures (including critical care time)  Medications Ordered in ED Medications - No data to display   Initial  Impression / Assessment and Plan / ED Course  I have reviewed the triage vital signs and the nursing notes.  Pertinent labs & imaging results that were available during my care of the patient were reviewed by me and considered in my medical decision making (  see chart for details).        Patient to be treated with course of prednisone as well as Robaxin and given referral to follow-up  Final Clinical Impressions(s) / ED Diagnoses   Final diagnoses:  None    ED Discharge Orders    None       Charlottie Peragine, AnthoLorre Nickny, MD 02/19/19 [phone]

## 2021-02-18 ENCOUNTER — Emergency Department (HOSPITAL_COMMUNITY): Admission: EM | Admit: 2021-02-18 | Discharge: 2021-02-18 | Discharge status: Against Medical Advice | Payer: Self-pay

## 2021-02-18 ENCOUNTER — Other Ambulatory Visit: Payer: Self-pay

## 2021-02-18 NOTE — ED Notes (Signed)
Called pt for triage. No response.  

## 2021-02-22 ENCOUNTER — Emergency Department (HOSPITAL_COMMUNITY)
Admission: EM | Admit: 2021-02-22 | Discharge: 2021-02-22 | Disposition: A | Discharge status: Home / Self Care | Payer: Self-pay | Attending: Emergency Medicine | Admitting: Emergency Medicine

## 2021-02-22 ENCOUNTER — Other Ambulatory Visit: Payer: Self-pay

## 2021-02-22 ENCOUNTER — Encounter (HOSPITAL_COMMUNITY): Payer: Self-pay | Admitting: Emergency Medicine

## 2021-02-22 DIAGNOSIS — L0291 Cutaneous abscess, unspecified: Secondary | ICD-10-CM

## 2021-02-22 DIAGNOSIS — Z23 Encounter for immunization: Secondary | ICD-10-CM | POA: Insufficient documentation

## 2021-02-22 DIAGNOSIS — Z7982 Long term (current) use of aspirin: Secondary | ICD-10-CM | POA: Insufficient documentation

## 2021-02-22 DIAGNOSIS — L03114 Cellulitis of left upper limb: Secondary | ICD-10-CM | POA: Insufficient documentation

## 2021-02-22 DIAGNOSIS — F17210 Nicotine dependence, cigarettes, uncomplicated: Secondary | ICD-10-CM | POA: Insufficient documentation

## 2021-02-22 MED ORDER — LIDOCAINE HCL (PF) 1 % IJ SOLN
5.0000 mL | Freq: Once | INTRAMUSCULAR | Status: AC
  Administered 2021-02-22: 5 mL
  Filled 2021-02-22: qty 5

## 2021-02-22 MED ORDER — IBUPROFEN 800 MG PO TABS
800.0000 mg | ORAL_TABLET | Freq: Once | ORAL | Status: AC
  Administered 2021-02-22: 800 mg via ORAL
  Filled 2021-02-22: qty 1

## 2021-02-22 MED ORDER — TETANUS-DIPHTH-ACELL PERTUSSIS 5-2.5-18.5 LF-MCG/0.5 IM SUSY
0.5000 mL | PREFILLED_SYRINGE | Freq: Once | INTRAMUSCULAR | Status: AC
  Administered 2021-02-22: 0.5 mL via INTRAMUSCULAR
  Filled 2021-02-22: qty 0.5

## 2021-02-22 MED ORDER — DOXYCYCLINE HYCLATE 100 MG PO CAPS
100.0000 mg | ORAL_CAPSULE | Freq: Two times a day (BID) | ORAL | 0 refills | Status: AC
Start: 2021-02-22 — End: 2021-03-01

## 2021-02-22 NOTE — ED Triage Notes (Signed)
Pt with abscess to left lateral extremity. Denies recent fever. VSS

## 2021-02-22 NOTE — ED Provider Notes (Signed)
[facility] EMERGENCY DEPARTMENT Provider Note   CSN: [id number] Arrival date & time: 02/22/21  1019     History Chief Complaint  Patient presents with  . Abscess    Brett Stokes is a 45 y.o. male.  HPI   Patient with no significant medical history presents to the emergency department with chief complaint of abscess on his left forearm.  Patient states he noted this on Thursday as he was tearing down a house.  He thinks that something bit his arm as he noted two abrasions next to the abscess, endorse that the area has gotten larger in size increased redness and has become more painful.  He denies systemic infection like fevers or chills, he is not immunocompromise, he has never had this happen in the past.  denies any alleviating factors.  Patient denies headaches, fevers, chills, shortness of breath, chest pain, abdominal pain, nausea vomiting diarrhea.  Past Medical History:  Diagnosis Date  . Anxiety    never been treated  . Arthritis   . Constipation   . GERD (gastroesophageal reflux disease)   . Headache     Patient Active Problem List   Diagnosis Date Noted  . History of back surgery 11/09/2014  . Smoking 08/24/2014  . Family history of diabetes mellitus (DM) 08/24/2014  . Right shoulder pain 08/24/2014    Past Surgical History:  Procedure Laterality Date  . APPENDECTOMY    . BACK SURGERY    . CHOLECYSTECTOMY    . SHOULDER ARTHROSCOPY Right 01/01/2015   Procedure: ARTHROSCOPY SHOULDER;  Surgeon: Nadara Mustard, MD;  Location: [facility] OR;  Service: Orthopedics;  Laterality: Right;       Family History  Problem Relation Age of Onset  . Cancer Father   . Stroke Father   . Diabetes Maternal Grandmother     Social History   Tobacco Use  . Smoking status: Current Every Day Smoker    Packs/day: 1.00    Years: 20.00    Pack years: 20.00    Types: Cigarettes  . Smokeless tobacco: Former Clinical biochemist  . Vaping Use: Never used  Substance  Use Topics  . Alcohol use: Yes    Alcohol/week: 0.0 standard drinks    Comment: socially   . Drug use: Yes    Types: Marijuana, Cocaine    Home Medications Prior to Admission medications   Medication Sig Start Date End Date Taking? Authorizing Provider  doxycycline (VIBRAMYCIN) 100 MG capsule Take 1 capsule (100 mg total) by mouth 2 (two) times daily for 7 days. 02/22/21 03/01/21 Yes Carroll Sage, PA-C  Aspirin-Acetaminophen [phone] MG PACK Take by mouth.    [provider]  buPROPion (WELLBUTRIN SR) 100 MG 12 hr tablet Take 1 tablet (100 mg total) by mouth 2 (two) times daily. Patient not taking: Reported on 12/21/2014 08/24/14   Doris Cheadle, MD  cyclobenzaprine (FLEXERIL) 10 MG tablet Take 1 tablet (10 mg total) by mouth 2 (two) times daily as needed for muscle spasms. 04/07/15   Janne Napoleon, NP  diclofenac (VOLTAREN) 75 MG EC tablet Take 1 tablet (75 mg total) by mouth 2 (two) times daily. 04/07/15   Janne Napoleon, NP  ibuprofen (ADVIL,MOTRIN) 800 MG tablet Take 1 tablet (800 mg total) by mouth every 8 (eight) hours as needed. Take with food. Patient not taking: Reported on 12/21/2014 08/24/14   Doris Cheadle, MD  methocarbamol (ROBAXIN) 500 MG tablet Take 1 tablet (500 mg total)  by mouth 2 (two) times daily. Patient not taking: Reported on 12/21/2014 09/19/14   Piepenbrink, Victorino Dike, PA-C  methocarbamol (ROBAXIN-750) 750 MG tablet Take 1 tablet (750 mg total) by mouth 4 (four) times daily. 02/19/19   Lorre Nick, MD  ondansetron (ZOFRAN ODT) 4 MG disintegrating tablet Take 1 tablet (4 mg total) by mouth every 8 (eight) hours as needed for nausea or vomiting. Patient not taking: Reported on 12/21/2014 09/19/14   Piepenbrink, Victorino Dike, PA-C  oxyCODONE-acetaminophen (ROXICET) 5-325 MG per tablet Take 1 tablet by mouth every 4 (four) hours as needed for severe pain. 01/01/15   Nadara Mustard, MD  predniSONE (STERAPRED UNI-PAK 21 TAB) 10 MG (21) TBPK tablet Take by mouth daily.  Take 6 tabs by mouth daily  for 2 days, then 5 tabs for 2 days, then 4 tabs for 2 days, then 3 tabs for 2 days, 2 tabs for 2 days, then 1 tab by mouth daily for 2 days 02/19/19   Lorre Nick, MD    Allergies    Patient has no known allergies.  Review of Systems   Review of Systems  Constitutional: Negative for chills and fever.  HENT: Negative for congestion.   Respiratory: Negative for shortness of breath.   Cardiovascular: Negative for chest pain.  Gastrointestinal: Negative for abdominal pain.  Genitourinary: Negative for enuresis.  Musculoskeletal: Negative for back pain.  Skin: Positive for wound. Negative for rash.       Abscess and redness on his left forearm  Neurological: Negative for dizziness.  Hematological: Does not bruise/bleed easily.    Physical Exam Updated Vital Signs BP 118/75 (BP Location: Right Arm)   Pulse 78   Temp 98 F (36.7 C)   Resp 17   Ht 6\' 1"  (1.854 m)   Wt 95.3 kg   SpO2 95%   BMI 27.71 kg/m   Physical Exam Vitals and nursing note reviewed.  Constitutional:      General: He is not in acute distress.    Appearance: Normal appearance. He is not ill-appearing or diaphoretic.  HENT:     Head: Normocephalic and atraumatic.     Nose: No congestion.  Eyes:     Conjunctiva/sclera: Conjunctivae normal.  Cardiovascular:     Rate and Rhythm: Normal rate and regular rhythm.  Pulmonary:     Effort: Pulmonary effort is normal.  Musculoskeletal:     Cervical back: Neck supple.  Skin:    General: Skin is warm and dry.     Coloration: Skin is not jaundiced or pale.     Comments: Patient's left forearm was visualized there is a small pustule like mass on the anterior aspect of the left forearm, with surrounding erythema, there was fluctuance and induration present, area is warm to the touch, tender to palpation.  Neurological:     Mental Status: He is alert.  Psychiatric:        Mood and Affect: Mood normal.     ED Results / Procedures /  Treatments   Labs (all labs ordered are listed, but only abnormal results are displayed) Labs Reviewed - No data to display  EKG None  Radiology No results found.  Procedures . Incision and Drainage  Date/Time: 02/22/2021 12:15 PM Performed by: 02/24/2021, PA-C Authorized by: Carroll Sage, PA-C   Consent:    Consent obtained:  Verbal   Consent given by:  Patient   Risks discussed:  Bleeding, incomplete drainage, pain, damage to other organs and infection  Alternatives discussed:  No treatment Universal protocol:    Patient identity confirmed:  Verbally with patient Location:    Type:  Abscess   Size:  2cm    Location:  Upper extremity   Upper extremity location:  Arm   Arm location:  L lower arm Pre-procedure details:    Skin preparation:  Povidone-iodine Sedation:    Sedation type:  None Anesthesia:    Anesthesia method:  Local infiltration   Local anesthetic:  Lidocaine 1% w/o epi Procedure type:    Complexity:  Simple Procedure details:    Ultrasound guidance: no     Needle aspiration: no     Incision types:  Single straight   Wound management:  Probed and deloculated   Drainage:  Bloody and purulent   Drainage amount:  Scant   Wound treatment:  Wound left open   Packing materials:  None Post-procedure details:    Procedure completion:  Tolerated well, no immediate complications     Medications Ordered in ED Medications  lidocaine (PF) (XYLOCAINE) 1 % injection 5 mL (5 mLs Infiltration Given 02/22/21 1142)  Tdap (BOOSTRIX) injection 0.5 mL (0.5 mLs Intramuscular Given 02/22/21 1142)  ibuprofen (ADVIL) tablet 800 mg (800 mg Oral Given 02/22/21 1158)    ED Course  I have reviewed the triage vital signs and the nursing notes.  Pertinent labs & imaging results that were available during my care of the patient were reviewed by me and considered in my medical decision making (see chart for details).    MDM Rules/Calculators/A&P                          Initial impression-patient presents with abscess on his left forearm.  He is alert, does not appear to be in acute distress, will update patient's tetanus shot, recommend I&D at this time.  Work-up-due to well-appearing patient, benign physical exam, further lab or imaging not warranted at this time  Reassessment Patient with skin abscess  located left forearm, amenable to incision and drainage.  Patient tolerated procedure well.  Abscess was not large enough to warrant packing.   Rule out-low suspicion for deep tissue infection after I&D as there is no fluctuance present my exam.  Low suspicion for systemic infection as patient nontoxic-appearing, vital signs reassuring.  low suspicion for necfas as there is no signs of necrotic skin on my exam.  Plan-  1.  Abscess I&D-suspect patient suffering from an abscess with overlying cellulitis, will start him on antibiotics, recommend basic wound care, follow-up in 3 days time if symptoms do not improve or worsen.  Vital signs have remained stable, no indication for hospital admission.  Patient discussed with attending and they agreed with assessment and plan.  Patient given at home care as well strict return precautions.  Patient verbalized that they understood agreed to said plan.   Final Clinical Impression(s) / ED Diagnoses Final diagnoses:  Abscess  Cellulitis of left upper extremity    Rx / DC Orders ED Discharge Orders         Ordered    doxycycline (VIBRAMYCIN) 100 MG capsule  2 times daily        02/22/21 1211           Barnie Del 02/22/21 1217    Cathren Laine, MD 02/24/21 1248

## 2021-02-22 NOTE — Discharge Instructions (Addendum)
Your abscess was I indeed, I started you on antibiotics please take as prescribed.  I recommend applying warm compresses to the area 3 times daily for the next 5 days.  I recommend over-the-counter pain medications like ibuprofen and Tylenol every 6 hours as needed  I want you to return back to emergency department in 3 days times if symptoms worsen, do not improve, you have worsening swelling, redness, pain, you develop fevers or chills as these are symptoms that require further evaluation.

## 2021-02-22 NOTE — ED Notes (Signed)
Discharge instructions discussed with pt. Pt verbalized understanding with no questions at this time.  

## 2021-05-09 ENCOUNTER — Encounter (HOSPITAL_COMMUNITY): Payer: Self-pay | Admitting: Emergency Medicine

## 2021-05-09 ENCOUNTER — Emergency Department (HOSPITAL_COMMUNITY)
Admission: EM | Admit: 2021-05-09 | Discharge: 2021-05-09 | Disposition: A | Discharge status: Home / Self Care | Payer: PRIVATE HEALTH INSURANCE | Attending: Emergency Medicine | Admitting: Emergency Medicine

## 2021-05-09 DIAGNOSIS — W57XXXA Bitten or stung by nonvenomous insect and other nonvenomous arthropods, initial encounter: Secondary | ICD-10-CM | POA: Diagnosis not present

## 2021-05-09 DIAGNOSIS — S0086XA Insect bite (nonvenomous) of other part of head, initial encounter: Secondary | ICD-10-CM | POA: Diagnosis not present

## 2021-05-09 DIAGNOSIS — F17210 Nicotine dependence, cigarettes, uncomplicated: Secondary | ICD-10-CM | POA: Insufficient documentation

## 2021-05-09 DIAGNOSIS — S50861A Insect bite (nonvenomous) of right forearm, initial encounter: Secondary | ICD-10-CM | POA: Insufficient documentation

## 2021-05-09 MED ORDER — DOXYCYCLINE HYCLATE 100 MG PO CAPS: 100.0000 mg | ORAL_CAPSULE | Freq: Two times a day (BID) | ORAL | 0 refills | Status: AC

## 2021-05-09 MED ORDER — DOXYCYCLINE HYCLATE 100 MG PO CAPS: 100.0000 mg | ORAL_CAPSULE | Freq: Two times a day (BID) | ORAL | 0 refills | Status: DC

## 2021-05-09 NOTE — ED Triage Notes (Signed)
Patient here via Sheriff in custody reporting spider bite to right side of head near brow and back of right arm that he noticed Wed night.

## 2021-05-09 NOTE — Discharge Instructions (Addendum)
We discussed treatments with warm compresses, antibiotics.  I have prescribed a dose of doxycycline, you will need to take 1 tablet twice a day for the next 7 days.  If you experience any fever, chills, worsening symptoms you will need to return to the emergency department.

## 2021-05-09 NOTE — ED Provider Notes (Signed)
[facility]-EMERGENCY DEPT Provider Note   CSN: [id number] Arrival date & time: 05/09/21  1629     History Chief Complaint  Patient presents with   Insect Bite    Brett Stokes is a 45 y.o. male.  45 y.o male with a PMH of Anxiety, Headache presents to the ED with a chief complaint under [facility] custody off right side spider bite, right arm redness which began about a week ago.  Reports similar episode in the month of May with an abscess to his left arm.  He reports being sent here as nursing staff became concerned at the unit.  There is active draining to the right forearm, indurated along with redness also noted.  He has not taken any medications for improvement in symptoms.  No fevers, no chills.  The history is provided by the patient and medical records.      Past Medical History:  Diagnosis Date   Anxiety    never been treated   Arthritis    Constipation    GERD (gastroesophageal reflux disease)    Headache     Patient Active Problem List   Diagnosis Date Noted   History of back surgery 11/09/2014   Smoking 08/24/2014   Family history of diabetes mellitus (DM) 08/24/2014   Right shoulder pain 08/24/2014    Past Surgical History:  Procedure Laterality Date   APPENDECTOMY     BACK SURGERY     CHOLECYSTECTOMY     SHOULDER ARTHROSCOPY Right 01/01/2015   Procedure: ARTHROSCOPY SHOULDER;  Surgeon: Nadara Mustard, MD;  Location: [facility] OR;  Service: Orthopedics;  Laterality: Right;       Family History  Problem Relation Age of Onset   Cancer Father    Stroke Father    Diabetes Maternal Grandmother     Social History   Tobacco Use   Smoking status: Every Day    Packs/day: 1.00    Years: 20.00    Pack years: 20.00    Types: Cigarettes   Smokeless tobacco: Former  Building services engineer Use: Never used  Substance Use Topics   Alcohol use: Yes    Alcohol/week: 0.0 standard drinks    Comment: socially    Drug use: Yes    Types: Marijuana,  Cocaine    Home Medications Prior to Admission medications   Medication Sig Start Date End Date Taking? Authorizing Provider  doxycycline (VIBRAMYCIN) 100 MG capsule Take 1 capsule (100 mg total) by mouth 2 (two) times daily for 7 days. 05/09/21 05/16/21 Yes Brett Manges, PA-C  Aspirin-Acetaminophen 500-325 MG PACK Take by mouth.    [provider]  buPROPion (WELLBUTRIN SR) 100 MG 12 hr tablet Take 1 tablet (100 mg total) by mouth 2 (two) times daily. Patient not taking: Reported on 12/21/2014 08/24/14   Doris Cheadle, MD  cyclobenzaprine (FLEXERIL) 10 MG tablet Take 1 tablet (10 mg total) by mouth 2 (two) times daily as needed for muscle spasms. 04/07/15   Janne Napoleon, NP  diclofenac (VOLTAREN) 75 MG EC tablet Take 1 tablet (75 mg total) by mouth 2 (two) times daily. 04/07/15   Janne Napoleon, NP  ibuprofen (ADVIL,MOTRIN) 800 MG tablet Take 1 tablet (800 mg total) by mouth every 8 (eight) hours as needed. Take with food. Patient not taking: Reported on 12/21/2014 08/24/14   Doris Cheadle, MD  methocarbamol (ROBAXIN) 500 MG tablet Take 1 tablet (500 mg total) by mouth 2 (two) times daily. Patient not  taking: Reported on 12/21/2014 09/19/14   Piepenbrink, Victorino Dike, PA-C  methocarbamol (ROBAXIN-750) 750 MG tablet Take 1 tablet (750 mg total) by mouth 4 (four) times daily. 02/19/19   Lorre Nick, MD  ondansetron (ZOFRAN ODT) 4 MG disintegrating tablet Take 1 tablet (4 mg total) by mouth every 8 (eight) hours as needed for nausea or vomiting. Patient not taking: Reported on 12/21/2014 09/19/14   Piepenbrink, Victorino Dike, PA-C  oxyCODONE-acetaminophen (ROXICET) 5-325 MG per tablet Take 1 tablet by mouth every 4 (four) hours as needed for severe pain. 01/01/15   Nadara Mustard, MD  predniSONE (STERAPRED UNI-PAK 21 TAB) 10 MG (21) TBPK tablet Take by mouth daily. Take 6 tabs by mouth daily  for 2 days, then 5 tabs for 2 days, then 4 tabs for 2 days, then 3 tabs for 2 days, 2 tabs for 2 days, then 1 tab  by mouth daily for 2 days 02/19/19   Lorre Nick, MD    Allergies    Patient has no known allergies.  Review of Systems   Review of Systems  Constitutional:  Negative for chills and fever.  Skin:  Positive for wound.   Physical Exam Updated Vital Signs BP 128/87 (BP Location: Right Arm)   Pulse 70   Temp 98.2 F (36.8 C) (Oral)   Resp 18   Ht 6\' 1"  (1.854 m)   Wt 93 kg   SpO2 97%   BMI 27.05 kg/m   Physical Exam Vitals and nursing note reviewed.  Constitutional:      Appearance: Normal appearance.  HENT:     Head: Normocephalic and atraumatic.     Mouth/Throat:     Mouth: Mucous membranes are moist.     Comments: Oropharynx is clear without any tonsillar exudate. Eyes:     Pupils: Pupils are equal, round, and reactive to light.  Cardiovascular:     Rate and Rhythm: Normal rate.  Pulmonary:     Effort: Pulmonary effort is normal.  Abdominal:     General: Abdomen is flat.  Musculoskeletal:     Cervical back: Normal range of motion and neck supple.  Skin:    General: Skin is warm and dry.     Findings: Erythema present.     Comments: Erythema, induration along with minimal fluctuance noted to the right forearm, with an active draining abscess.  Neurological:     Mental Status: He is alert and oriented to person, place, and time.    ED Results / Procedures / Treatments   Labs (all labs ordered are listed, but only abnormal results are displayed) Labs Reviewed - No data to display  EKG None  Radiology No results found.  Procedures Procedures   Medications Ordered in ED Medications - No data to display  ED Course  I have reviewed the triage vital signs and the nursing notes.  Pertinent labs & imaging results that were available during my care of the patient were reviewed by me and considered in my medical decision making (see chart for details).    MDM Rules/Calculators/A&P  Patient presents to the ED brought in under [facility] custody for insect  bite to his right forehead along with right forearm.  According to chart review, previously seen for the same in the past and had an I&D to the left forearm.  He is reporting that the wound to his right face, right forearm are connected, however the right face appears dry, scabbed over.  The one around the right forearm appears  to be indurated, slight erythema, actively draining at this time.  We discussed this wound is actively draining, to continue with warm compresses, will also be trialed on doxycycline to help to treat an underlying cellulitis.  He is without any fever or chills, patient understands and agrees with management, taken by [facility] under custody.  Portions of this note were generated with Scientist, clinical (histocompatibility and immunogenetics). Dictation errors may occur despite best attempts at proofreading.  Final Clinical Impression(s) / ED Diagnoses Final diagnoses:  Insect bite of right forearm, initial encounter    Rx / DC Orders ED Discharge Orders          Ordered    doxycycline (VIBRAMYCIN) 100 MG capsule  2 times daily        05/09/21 1726             Brett Manges, PA-C 05/09/21 1738    Mancel Bale, MD 05/10/21 1017

## 2022-08-12 ENCOUNTER — Encounter (HOSPITAL_COMMUNITY): Payer: Self-pay | Admitting: Emergency Medicine

## 2022-08-12 ENCOUNTER — Other Ambulatory Visit: Payer: Self-pay

## 2022-08-12 ENCOUNTER — Emergency Department (HOSPITAL_COMMUNITY)
Admission: EM | Admit: 2022-08-12 | Discharge: 2022-08-13 | Discharge status: Against Medical Advice | Attending: Emergency Medicine | Admitting: Emergency Medicine

## 2022-08-12 DIAGNOSIS — K047 Periapical abscess without sinus: Secondary | ICD-10-CM | POA: Diagnosis present

## 2022-08-12 DIAGNOSIS — Z5321 Procedure and treatment not carried out due to patient leaving prior to being seen by health care provider: Secondary | ICD-10-CM | POA: Insufficient documentation

## 2022-08-12 LAB — BASIC METABOLIC PANEL
Anion gap: 6 (ref 5–15)
BUN: 9 mg/dL (ref 6–20)
CO2: 26 mmol/L (ref 22–32)
Calcium: 9.1 mg/dL (ref 8.9–10.3)
Chloride: 107 mmol/L (ref 98–111)
Creatinine, Ser: 0.78 mg/dL (ref 0.61–1.24)
GFR, Estimated: >60 mL/min (ref >60)
Glucose, Bld: 114 mg/dL — ABNORMAL HIGH (ref 70–99)
Potassium: 3.3 mmol/L — ABNORMAL LOW (ref 3.5–5.1)
Sodium: 139 mmol/L (ref 135–145)

## 2022-08-12 LAB — CBC
HCT: 43.1 % (ref 39.0–52.0)
Hemoglobin: 14.6 g/dL (ref 13.0–17.0)
MCH: 32.7 pg (ref 26.0–34.0)
MCHC: 33.9 g/dL (ref 30.0–36.0)
MCV: 96.4 fL (ref 80.0–100.0)
Platelets: 230 10*3/uL (ref 150–400)
RBC: 4.47 MIL/uL (ref 4.22–5.81)
RDW: 12.8 % (ref 11.5–15.5)
WBC: 13.1 10*3/uL — ABNORMAL HIGH (ref 4.0–10.5)
nRBC: 0 % (ref 0.0–0.2)

## 2022-08-12 MED ORDER — HYDROCODONE-ACETAMINOPHEN 5-325 MG PO TABS
1.0000 | ORAL_TABLET | Freq: Once | ORAL | Status: AC
  Administered 2022-08-12: 1 via ORAL
  Filled 2022-08-12: qty 1

## 2022-08-12 NOTE — ED Triage Notes (Signed)
Pt reported to ED with c/o abscess to left side of face. States he has been having dental pain around area that has been ongoing for several months but pain has increased over past several months.

## 2022-08-12 NOTE — ED Provider Triage Note (Signed)
Emergency Medicine Provider Triage Evaluation Note  RINGO SHEROD , a 46 y.o. male  was evaluated in triage.  Pt complains of dental abscess.  Patient reports that symptoms began 2 to 3 days ago.  Patient denies any fevers however does endorse trouble swallowing.  Patient denies any nausea or vomiting.  Patient states the last time he saw a dentist was "I have seen to dentist in my entire life".  Review of Systems  Positive:  Negative:   Physical Exam  There were no vitals taken for this visit. Gen:   Awake, no distress   Resp:  Normal effort  MSK:   Moves extremities without difficulty  Other:  Obvious swelling to the left side of patient face  Medical Decision Making  Medically screening exam initiated at 8:36 PM.  Appropriate orders placed.  ARA MANO was informed that the remainder of the evaluation will be completed by another provider, this initial triage assessment does not replace that evaluation, and the importance of remaining in the ED until their evaluation is complete.     Al Decant, PA-C 08/12/22 2040

## 2022-08-13 NOTE — ED Notes (Signed)
Pt called for VS, no response.  °

## 2023-03-13 ENCOUNTER — Emergency Department (HOSPITAL_COMMUNITY): Payer: 59

## 2023-03-13 ENCOUNTER — Emergency Department (HOSPITAL_COMMUNITY)
Admission: EM | Admit: 2023-03-13 | Discharge: 2023-03-13 | Disposition: A | Discharge status: Home / Self Care | Payer: 59 | Attending: Emergency Medicine | Admitting: Emergency Medicine

## 2023-03-13 ENCOUNTER — Encounter (HOSPITAL_COMMUNITY): Payer: Self-pay

## 2023-03-13 ENCOUNTER — Other Ambulatory Visit: Payer: Self-pay

## 2023-03-13 DIAGNOSIS — M5416 Radiculopathy, lumbar region: Secondary | ICD-10-CM | POA: Insufficient documentation

## 2023-03-13 DIAGNOSIS — R29898 Other symptoms and signs involving the musculoskeletal system: Secondary | ICD-10-CM | POA: Diagnosis not present

## 2023-03-13 DIAGNOSIS — M545 Low back pain, unspecified: Secondary | ICD-10-CM | POA: Diagnosis not present

## 2023-03-13 MED ORDER — LIDOCAINE 5 % EX PTCH
1.0000 | MEDICATED_PATCH | CUTANEOUS | Status: DC
  Administered 2023-03-13: 1 via TRANSDERMAL
  Filled 2023-03-13: qty 1

## 2023-03-13 MED ORDER — IBUPROFEN 800 MG PO TABS
800.0000 mg | ORAL_TABLET | Freq: Once | ORAL | Status: AC
  Administered 2023-03-13: 800 mg via ORAL
  Filled 2023-03-13: qty 1

## 2023-03-13 MED ORDER — CYCLOBENZAPRINE HCL 10 MG PO TABS: 10.0000 mg | ORAL_TABLET | Freq: Two times a day (BID) | ORAL | 0 refills | Status: AC | PRN

## 2023-03-13 MED ORDER — CYCLOBENZAPRINE HCL 10 MG PO TABS
10.0000 mg | ORAL_TABLET | Freq: Once | ORAL | Status: AC
  Administered 2023-03-13: 10 mg via ORAL
  Filled 2023-03-13: qty 1

## 2023-03-13 MED ORDER — CYCLOBENZAPRINE HCL 10 MG PO TABS: 10.0000 mg | ORAL_TABLET | Freq: Two times a day (BID) | ORAL | 0 refills | Status: DC | PRN

## 2023-03-13 MED ORDER — METHYLPREDNISOLONE 4 MG PO TBPK: ORAL_TABLET | ORAL | 0 refills | Status: DC

## 2023-03-13 NOTE — ED Triage Notes (Signed)
Patient is here for evaluation of back pain. Reports having surgery for his back in his 30's and now has chronic back pain. Also reports falling in bushes and thinks he has poison oak on his legs and groin.

## 2023-03-13 NOTE — Discharge Instructions (Signed)
You were seen for your back pain in the emergency department.   At home, please use Tylenol, ibuprofen, lidocaine patches, and the cyclobenzaprine we have prescribed you as needed for pain.  Do not take the cyclobenzaprine before driving or operating heavy machinery.  Please also take the steroid Dosepak for back pain as well.33  Follow-up with your primary doctor in 2-3 days regarding your visit.  Follow-up with the spine clinic as soon as possible regarding your symptoms.  Return immediately to the emergency department if you experience any of the following: Numbness or weakness of your legs, bowel or bladder incontinence, numbness while wiping after pooping or urinating, or any other concerning symptoms.    Thank you for visiting our Emergency Department. It was a pleasure taking care of you today.

## 2023-03-13 NOTE — ED Provider Notes (Signed)
[facility] EMERGENCY DEPARTMENT AT [facility] Provider Note   CSN: [id number] Arrival date & time: 03/13/23  0800     History  Chief Complaint  Patient presents with   Back Pain    Brett Stokes is a 47 y.o. male.  47 year old male with a history of degenerative disc disease status post L4-L5 spinal fusion at the age of 56 who presents emergency department with back pain and leg weakness.  Patient states that he has had ongoing back pain since he was 47 years old.  Says that recently his legs have been giving out and has been having frequent falls.  Says that his left leg feels weaker than his right and is also numb.  No bowel or bladder incontinence but says he is having to push harder than usual to have bowel movements.  Also thinks he may have poison oak on his legs and left eye.  Denies any fevers, weight loss, or history of IV drug use at all.  Does not have any medications to take for his back pain.       Home Medications Prior to Admission medications   Medication Sig Start Date End Date Taking? Authorizing Provider  methylPREDNISolone (MEDROL DOSEPAK) 4 MG TBPK tablet Take as directed on packaging 03/13/23  Yes Rondel Baton, MD  Aspirin-Acetaminophen [phone] MG PACK Take by mouth.    [provider]  buPROPion (WELLBUTRIN SR) 100 MG 12 hr tablet Take 1 tablet (100 mg total) by mouth 2 (two) times daily. Patient not taking: Reported on 12/21/2014 08/24/14   Doris Cheadle, MD  cyclobenzaprine (FLEXERIL) 10 MG tablet Take 1 tablet (10 mg total) by mouth 2 (two) times daily as needed for muscle spasms. 03/13/23   Rondel Baton, MD  diclofenac (VOLTAREN) 75 MG EC tablet Take 1 tablet (75 mg total) by mouth 2 (two) times daily. 04/07/15   Janne Napoleon, NP  ibuprofen (ADVIL,MOTRIN) 800 MG tablet Take 1 tablet (800 mg total) by mouth every 8 (eight) hours as needed. Take with food. Patient not taking: Reported on 12/21/2014 08/24/14   Doris Cheadle, MD   methocarbamol (ROBAXIN) 500 MG tablet Take 1 tablet (500 mg total) by mouth 2 (two) times daily. Patient not taking: Reported on 12/21/2014 09/19/14   Piepenbrink, Victorino Dike, PA-C  methocarbamol (ROBAXIN-750) 750 MG tablet Take 1 tablet (750 mg total) by mouth 4 (four) times daily. 02/19/19   Lorre Nick, MD  ondansetron (ZOFRAN ODT) 4 MG disintegrating tablet Take 1 tablet (4 mg total) by mouth every 8 (eight) hours as needed for nausea or vomiting. Patient not taking: Reported on 12/21/2014 09/19/14   Piepenbrink, Victorino Dike, PA-C  oxyCODONE-acetaminophen (ROXICET) 5-325 MG per tablet Take 1 tablet by mouth every 4 (four) hours as needed for severe pain. 01/01/15   Nadara Mustard, MD  predniSONE (STERAPRED UNI-PAK 21 TAB) 10 MG (21) TBPK tablet Take by mouth daily. Take 6 tabs by mouth daily  for 2 days, then 5 tabs for 2 days, then 4 tabs for 2 days, then 3 tabs for 2 days, 2 tabs for 2 days, then 1 tab by mouth daily for 2 days 02/19/19   Lorre Nick, MD      Allergies    Patient has no known allergies.    Review of Systems   Review of Systems  Physical Exam Updated Vital Signs BP 117/74 (BP Location: Right Arm)   Pulse 68   Temp 97.9 F (36.6 C) (Oral)  Resp 16   Ht 6\' 1"  (1.854 m)   Wt 95.3 kg   SpO2 97%   BMI 27.71 kg/m  Physical Exam Vitals and nursing note reviewed.  Constitutional:      General: He is not in acute distress.    Appearance: He is well-developed.  HENT:     Head: Normocephalic and atraumatic.     Comments: Left eye periorbital swelling that is minimal    Right Ear: External ear normal.     Left Ear: External ear normal.     Nose: Nose normal.  Eyes:     Extraocular Movements: Extraocular movements intact.     Conjunctiva/sclera: Conjunctivae normal.     Pupils: Pupils are equal, round, and reactive to light.  Pulmonary:     Effort: Pulmonary effort is normal. No respiratory distress.  Musculoskeletal:     Cervical back: Normal range of motion and  neck supple.     Right lower leg: No edema.     Left lower leg: No edema.     Comments: No spinal midline TTP in cervical, thoracic, or lumbar spine. No stepoffs noted.  Scars from prior spinal fusion noted.  Motor: Muscle bulk and tone are normal. Strength is 3/5 in left hip flexion, knee flexion and extension, ankle dorsiflexion and plantar flexion. Strength is 4/5 in right hip flexion, knee flexion and extension, ankle dorsiflexion and plantar flexion. Full strength of great toe dorsiflexion bilaterally.  Sensory: Diminished sensation to light touch in L2 though S1 dermatomes of the left lower extremity.   Skin:    General: Skin is warm and dry.  Neurological:     Mental Status: He is alert. Mental status is at baseline.  Psychiatric:        Mood and Affect: Mood normal.        Behavior: Behavior normal.     ED Results / Procedures / Treatments   Labs (all labs ordered are listed, but only abnormal results are displayed) Labs Reviewed - No data to display  EKG None  Radiology MR LUMBAR SPINE WO CONTRAST  Result Date: 03/13/2023 CLINICAL DATA:  back pain, LLE weakness, difficulty having BMs EXAM: MRI LUMBAR SPINE WITHOUT CONTRAST TECHNIQUE: Multiplanar, multisequence MR imaging of the lumbar spine was performed. No intravenous contrast was administered. COMPARISON:  MR L SPine 02/15/15 FINDINGS: Segmentation:  Standard. Alignment:  Physiologic. Vertebrae:  No fracture, evidence of discitis, or bone lesion. Conus medullaris and cauda equina: Conus extends to the L1 level. Conus and cauda equina appear normal. Paraspinal and other soft tissues: 1.1 cm periaortic lymph node (series 8, image 9). Disc levels: T12-L1: No significant disc bulge. Mild bilateral facet degenerative change. No spinal canal narrowing. No neural foraminal narrowing. L1-L2: No significant disc bulge. Mild bilateral facet degenerative change. No spinal canal narrowing. No neural foraminal narrowing. L2-L3: No  significant disc bulge. Mild bilateral facet degenerative change with fluid in the facet joints. No spinal canal narrowing. Mild bilateral neural foraminal narrowing. L3-L4: Circumferential disc bulge. Moderate bilateral facet degenerative change. Moderate spinal canal narrowing. Mild bilateral neural foraminal narrowing. L4-L5: Eccentric left disc bulge with effacement of the left lateral recess and severe narrowing of the right lateral recess. There is moderate to severe spinal canal narrowing. Moderate bilateral neural foraminal narrowing. This has progressed compared to 2016. L5-S1: Eccentric left disc bulge with moderate narrowing of the left lateral recess. Moderate bilateral facet degenerative change. Mild spinal canal narrowing. Moderate left and mild right neural foraminal IMPRESSION:  1. Eccentric left disc bulge at L4-L5 with effacement of the left lateral recess and severe narrowing of the right lateral recess. There is moderate to severe spinal canal narrowing at this level. This has progressed compared to 2016. 2. Eccentric left disc bulge at L5-S1 with moderate narrowing of the left lateral recess, unchanged from prior. 3. Moderate spinal canal narrowing at L3-L4, unchanged from prior. Electronically Signed   By: Lorenza Cambridge M.D.   On: 03/13/2023 14:37    Procedures Procedures    Medications Ordered in ED Medications  lidocaine (LIDODERM) 5 % 1 patch (1 patch Transdermal Patch Applied 03/13/23 1014)  cyclobenzaprine (FLEXERIL) tablet 10 mg (10 mg Oral Given by Other 03/13/23 1014)  ibuprofen (ADVIL) tablet 800 mg (800 mg Oral Given 03/13/23 1014)    ED Course/ Medical Decision Making/ A&P                             Medical Decision Making Amount and/or Complexity of Data Reviewed Radiology: ordered.  Risk Prescription drug management.   DEWEL GAVILANES is a 47 y.o. male with comorbidities that complicate the patient evaluation including degenerative disc disease status post  L4-L5 fusion who presents emergency department with back pain and leg weakness  Initial Ddx:  Spinal cord compression, lumbar radiculopathy, spinal epidural abscess, spinal epidural hematoma, muscular strain  MDM/Course:  The patient likely has lumbar radiculopathy causing his symptoms I am worried about his weakness so we will obtain an MRI at this time to rule out any signs of spinal cord compression.  No risk factors for spinal epidural abscess or hematoma.  Given the radiation of his pain down his left leg feel that muscular strain is less likely.  Patient did have MRI which showed severe and progressive degenerative joint disease with some canal stenosis but no cord compression or signal change of the cord or cauda equina fibers.  Upon re-evaluation after receiving pain medications the patient left lower extremity weakness that improved and suspect this is likely limited due to pain since using the hurts significantly when he tries to move his leg.  Let the patient follow-up with the spinal surgeons as an outpatient was given cyclobenzaprine and Medrol Dosepak instructed to take Tylenol and ibuprofen along with lidocaine patches for his pain.  This patient presents to the ED for concern of complaints listed in HPI, this involves an extensive number of treatment options, and is a complaint that carries with it a high risk of complications and morbidity. Disposition including potential need for admission considered.   Dispo: DC Home. Return precautions discussed including, but not limited to, those listed in the AVS. Allowed pt time to ask questions which were answered fully prior to dc.  Records reviewed Outpatient Clinic Notes I have reviewed the patients home medications and made adjustments as needed       Final Clinical Impression(s) / ED Diagnoses Final diagnoses:  Lumbar radiculopathy    Rx / DC Orders ED Discharge Orders          Ordered    methylPREDNISolone (MEDROL DOSEPAK)  4 MG TBPK tablet        03/13/23 1520    cyclobenzaprine (FLEXERIL) 10 MG tablet  2 times daily PRN,   Status:  Discontinued        03/13/23 1520    cyclobenzaprine (FLEXERIL) 10 MG tablet  2 times daily PRN        03/13/23  1523              Rondel Baton, MD 03/13/23 1529

## 2023-07-06 ENCOUNTER — Encounter (HOSPITAL_COMMUNITY): Payer: Self-pay

## 2023-07-06 ENCOUNTER — Emergency Department (HOSPITAL_COMMUNITY)
Admission: EM | Admit: 2023-07-06 | Discharge: 2023-07-06 | Discharge status: Against Medical Advice | Attending: Emergency Medicine | Admitting: Emergency Medicine

## 2023-07-06 ENCOUNTER — Other Ambulatory Visit: Payer: Self-pay

## 2023-07-06 DIAGNOSIS — M545 Low back pain, unspecified: Secondary | ICD-10-CM | POA: Insufficient documentation

## 2023-07-06 DIAGNOSIS — R202 Paresthesia of skin: Secondary | ICD-10-CM | POA: Insufficient documentation

## 2023-07-06 DIAGNOSIS — Z5321 Procedure and treatment not carried out due to patient leaving prior to being seen by health care provider: Secondary | ICD-10-CM | POA: Insufficient documentation

## 2023-07-06 DIAGNOSIS — R200 Anesthesia of skin: Secondary | ICD-10-CM | POA: Insufficient documentation

## 2023-07-06 NOTE — ED Triage Notes (Signed)
Pt c/o pain left lower back that radiates down left legx18 yrs. PT states got worse the beginning of this year. Pt c/o numbness and tingling in left leg. PT was able to walk to triage form waiting area.

## 2023-07-06 NOTE — ED Notes (Signed)
Pt walked up to desk and told me that "he was going to leave because he has been waiting for three hours it feels like" I told him that it was up to him if he wanted to leave. Pt stated that he wanted to leave and started to rip off his bp cuff as well as his arm band, pt then proceeded out the door saying that he was going to [place]. Pt did not have an IV.

## 2024-02-12 ENCOUNTER — Ambulatory Visit (INDEPENDENT_AMBULATORY_CARE_PROVIDER_SITE_OTHER): Admitting: Family

## 2024-02-12 ENCOUNTER — Encounter: Payer: Self-pay | Admitting: Family

## 2024-02-12 VITALS — BP 123/76 | HR 73 | Temp 98.1°F | Resp 18 | Ht 73.0 in | Wt 199.2 lb

## 2024-02-12 DIAGNOSIS — G8929 Other chronic pain: Secondary | ICD-10-CM

## 2024-02-12 DIAGNOSIS — K921 Melena: Secondary | ICD-10-CM

## 2024-02-12 DIAGNOSIS — M549 Dorsalgia, unspecified: Secondary | ICD-10-CM

## 2024-02-12 DIAGNOSIS — M79604 Pain in right leg: Secondary | ICD-10-CM

## 2024-02-12 DIAGNOSIS — Z1211 Encounter for screening for malignant neoplasm of colon: Secondary | ICD-10-CM

## 2024-02-12 DIAGNOSIS — G629 Polyneuropathy, unspecified: Secondary | ICD-10-CM | POA: Diagnosis not present

## 2024-02-12 DIAGNOSIS — R413 Other amnesia: Secondary | ICD-10-CM

## 2024-02-12 DIAGNOSIS — M5430 Sciatica, unspecified side: Secondary | ICD-10-CM | POA: Diagnosis not present

## 2024-02-12 DIAGNOSIS — Z7689 Persons encountering health services in other specified circumstances: Secondary | ICD-10-CM

## 2024-02-12 DIAGNOSIS — Z0100 Encounter for examination of eyes and vision without abnormal findings: Secondary | ICD-10-CM

## 2024-02-12 DIAGNOSIS — Z0120 Encounter for dental examination and cleaning without abnormal findings: Secondary | ICD-10-CM

## 2024-02-12 MED ORDER — GABAPENTIN 300 MG PO CAPS: 300.0000 mg | ORAL_CAPSULE | Freq: Every day | ORAL | 0 refills | Status: AC

## 2024-02-12 NOTE — Progress Notes (Signed)
 Bleeding in Bowel.  Sciatic nerve pain, referral to eye doctor or dentist

## 2024-02-12 NOTE — Progress Notes (Signed)
 Subjective:    Brett Stokes - 48 y.o. male MRN [id number]  Date of birth: Feb 27, 1976  HPI  Brett Stokes is to establish care.   Current issues and/or concerns: - Chronic back pain and sciatica. States sometimes his left leg "gives out on him" and he will fall. Reports he had a fall on last week but caught his fall and did not hit his head or lose consciousness. Denies recent trauma/injury and red flag symptoms. States he had back surgery 30 years ago.  - States right lower leg soreness. Denies red flag symptoms.  - Neuropathy. States feels like pins and needles especially of left lower extremity.  - Blurry vision. Requests referral to eye doctor.  - Requests referral to dentist for routine exam.  - States short-term memory loss. He would like to be seen by a specialist.  - Blood in stool. - No further issues/concerns for discussion today.   ROS per HPI     Health Maintenance:  Health Maintenance Due  Topic Date Due   HIV Screening  Never done   Hepatitis C Screening  Never done   Colonoscopy  Never done     Past Medical History: Patient Active Problem List   Diagnosis Date Noted   History of back surgery 11/09/2014   Smoking 08/24/2014   Family history of diabetes mellitus (DM) 08/24/2014   Right shoulder pain 08/24/2014      Social History   reports that he has been smoking cigarettes. He has a 20 pack-year smoking history. He has quit using smokeless tobacco. He reports current alcohol use. He reports current drug use. Drugs: Marijuana and Cocaine.   Family History  family history includes Cancer in his father; Diabetes in his maternal grandmother; Stroke in his father.   Medications: reviewed and updated   Objective:   Physical Exam BP 123/76   Pulse 73   Temp 98.1 F (36.7 C) (Oral)   Resp 18   Ht 6\' 1"  (1.854 m)   Wt 199 lb 3.2 oz (90.4 kg)   SpO2 94%   BMI 26.28 kg/m   Physical Exam HENT:     Head: Normocephalic and atraumatic.     Nose:  Nose normal.     Mouth/Throat:     Mouth: Mucous membranes are moist.     Pharynx: Oropharynx is clear.  Eyes:     Extraocular Movements: Extraocular movements intact.     Conjunctiva/sclera: Conjunctivae normal.     Pupils: Pupils are equal, round, and reactive to light.  Cardiovascular:     Rate and Rhythm: Normal rate and regular rhythm.     Pulses: Normal pulses.     Heart sounds: Normal heart sounds.  Pulmonary:     Effort: Pulmonary effort is normal.     Breath sounds: Normal breath sounds.  Musculoskeletal:        General: Normal range of motion.     Right shoulder: Normal.     Left shoulder: Normal.     Right upper arm: Normal.     Left upper arm: Normal.     Right elbow: Normal.     Left elbow: Normal.     Right forearm: Normal.     Left forearm: Normal.     Right wrist: Normal.     Left wrist: Normal.     Right hand: Normal.     Left hand: Normal.     Cervical back: Normal, normal range of motion and neck supple.  Thoracic back: Normal.     Lumbar back: Normal.     Right hip: Normal.     Left hip: Normal.     Right upper leg: Normal.     Left upper leg: Normal.     Right knee: Normal.     Left knee: Normal.     Right lower leg: Normal.     Left lower leg: Normal.     Right ankle: Normal.     Left ankle: Normal.     Right foot: Normal.     Left foot: Normal.  Neurological:     General: No focal deficit present.     Mental Status: He is alert and oriented to person, place, and time.  Psychiatric:        Mood and Affect: Mood normal.        Behavior: Behavior normal.       Assessment & Plan:  1. Encounter to establish care (Primary) - Patient presents today to establish care. During the interim follow-up with primary provider as scheduled.  - Return for annual physical examination, labs, and health maintenance. Arrive fasting meaning having no food for at least 8 hours prior to appointment. You may have only water or black coffee. Please take  scheduled medications as normal.  2. Chronic back pain, unspecified back location, unspecified back pain laterality - Gabapentin as prescribed. Counseled on medication adherence/adverse effects.  - Referral to Orthopedics for evaluation/management. - Follow-up with primary provider as scheduled. - Ambulatory referral to Orthopedic Surgery - gabapentin (NEURONTIN) 300 MG capsule; Take 1 capsule (300 mg total) by mouth at bedtime.  Dispense: 90 capsule; Refill: 0  3. Neuropathy 4. Sciatica, unspecified laterality - Gabapentin as prescribed. Counseled on medication adherence/adverse effects.  - Referral to Neurology for evaluation/management. - Follow-up with primary provider as scheduled. - Ambulatory referral to Neurology - gabapentin (NEURONTIN) 300 MG capsule; Take 1 capsule (300 mg total) by mouth at bedtime.  Dispense: 90 capsule; Refill: 0  5. Pain of right lower extremity - Routine screening.  - VAS US  LOWER EXTREMITY VENOUS (DVT); Future  6. Routine eye exam - Referral to Ophthalmology for evaluation/management. - Ambulatory referral to Ophthalmology  7. Encounter for routine dental examination - Referral to Dentistry for evaluation/management. - Ambulatory referral to Dentistry  8. Memory loss - Referral to Neurology for evaluation/management. - Ambulatory referral to Neurology  9. Colon cancer screening 10. Blood in stool - Referral to Gastroenterology for colon cancer screening by colonoscopy. - Ambulatory referral to Gastroenterology   Patient was given clear instructions to go to Emergency Department or return to medical center if symptoms don't improve, worsen, or new problems develop.The patient verbalized understanding.  I discussed the assessment and treatment plan with the patient. The patient was provided an opportunity to ask questions and all were answered. The patient agreed with the plan and demonstrated an understanding of the instructions.   The  patient was advised to call back or seek an in-person evaluation if the symptoms worsen or if the condition fails to improve as anticipated.    Lavona Pounds, NP 02/12/2024, 2:06 PM Primary Care at [facility]

## 2024-02-13 ENCOUNTER — Telehealth: Payer: Self-pay

## 2024-02-13 NOTE — Telephone Encounter (Signed)
 Copied from CRM [id number]. Topic: Clinical - Request for Lab/Test Order >> Feb 13, 2024 10:49 AM Donald Frost wrote: Reason for CRM: Dorothy with [facility] called inquiring if the prior authorization has been obtained through the patients insurance for his VAS US  LOWER EXTREMITY VENOUS (DVT) that the provider order yesterday at his visit. Please assist as soon as possible because she is wanting to get this scheduled today if she can. Please call [phone]

## 2024-02-13 NOTE — Telephone Encounter (Signed)
Please assist with prior authorization. Thank you

## 2024-02-13 NOTE — Telephone Encounter (Signed)
 Routing to office

## 2024-02-14 ENCOUNTER — Ambulatory Visit (HOSPITAL_COMMUNITY)
Admission: RE | Admit: 2024-02-14 | Discharge: 2024-02-14 | Disposition: A | Discharge status: Home / Self Care | Source: Ambulatory Visit | Attending: Vascular Surgery | Admitting: Vascular Surgery

## 2024-02-14 ENCOUNTER — Ambulatory Visit: Payer: Self-pay | Admitting: Family

## 2024-02-14 ENCOUNTER — Telehealth: Payer: Self-pay

## 2024-02-14 DIAGNOSIS — M79604 Pain in right leg: Secondary | ICD-10-CM | POA: Diagnosis present

## 2024-02-14 NOTE — Telephone Encounter (Signed)
 Copied from CRM [id number]. Topic: Clinical - Lab/Test Results >> Feb 14, 2024  9:57 AM Georgeann Kindred wrote: Reason for CRM: Amalia Badder form [facility] called to provide results for right leg test that was ordered by Dr. Rogerio Clay. Amalia Badder states that the test was negative.

## 2024-02-14 NOTE — Telephone Encounter (Signed)
 FYI

## 2024-02-15 NOTE — Telephone Encounter (Signed)
 Patient had appt  02/14/2024

## 2024-03-06 ENCOUNTER — Ambulatory Visit (INDEPENDENT_AMBULATORY_CARE_PROVIDER_SITE_OTHER): Admitting: Physical Medicine and Rehabilitation

## 2024-03-06 ENCOUNTER — Encounter: Payer: Self-pay | Admitting: Physical Medicine and Rehabilitation

## 2024-03-06 DIAGNOSIS — M48062 Spinal stenosis, lumbar region with neurogenic claudication: Secondary | ICD-10-CM | POA: Diagnosis not present

## 2024-03-06 DIAGNOSIS — M5441 Lumbago with sciatica, right side: Secondary | ICD-10-CM | POA: Diagnosis not present

## 2024-03-06 DIAGNOSIS — M5416 Radiculopathy, lumbar region: Secondary | ICD-10-CM | POA: Diagnosis not present

## 2024-03-06 DIAGNOSIS — M5442 Lumbago with sciatica, left side: Secondary | ICD-10-CM

## 2024-03-06 DIAGNOSIS — G8929 Other chronic pain: Secondary | ICD-10-CM

## 2024-03-06 NOTE — Progress Notes (Signed)
 Pain Scale   Average Pain 7 Patient advising that his lower back pain radiates bilaterally to both hips.        +Driver, -BT, -Dye Allergies.

## 2024-03-06 NOTE — Progress Notes (Signed)
 Brett Stokes - 48 y.o. male MRN [id number]  Date of birth: 05-16-1976  Office Visit Note: Visit Date: 03/06/2024 PCP: Senaida Dama, NP Referred by: Senaida Dama, NP  Subjective: Chief Complaint  Patient presents with   Lower Back - Pain   HPI: Brett Stokes is a 48 y.o. male who comes in today per the request of Lavona Pounds, NP for evaluation of chronic, worsening and severe bilateral lower back pain radiating to hips and down legs. Feels both of his legs are weak, left greater than right. Also reports numbness/tingling to both legs. Pain ongoing for several years. His pain worsens with prolonged standing and walking. Prolonged sitting also causes pain. He describes pain as sharp, sore and burning, currently rates as 8 out of 10. Some relief of pain with home exercise regimen, rest and use of medications. No history of formal physical therapy. Lumbar MRI imaging from 2024 shows  left disc bulge with effacement of the left lateral recess and severe narrowing of the right lateral recess at L4-L5. There is also moderate to severe spinal canal stenosis at this level. Moderate spinal canal narrowing at L3-L4. History of L4-L5 and L5-S1 discectomy with Dr. Audie Bleacher. States good relief of pain post surgery for about 2 years. 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 No recent trauma or falls.      Review of Systems  Musculoskeletal:  Positive for back pain.  Neurological:  Negative for tingling, sensory change, focal weakness and weakness.  All other systems reviewed and are negative.  Otherwise per HPI.  Assessment & Plan: Visit Diagnoses:    ICD-10-CM   1. Chronic bilateral low back pain with bilateral sciatica  M54.42    M54.41    G89.29     2. Radiculopathy, lumbar region  M54.16     3. Spinal stenosis of lumbar region with neurogenic claudication  M48.062        Plan: Findings:  Chronic, worsening and severe bilateral lower back pain radiating to hips and  down legs. Paresthesias to bilateral lower extremities. Patient continues to have severe pain despite good conservative therapies such as formal physical therapy, home exercise regimen, rest and use of medications. Patients clinical presentation and exam are consistent with neurogenic claudication as a result of spinal canal stenosis. There is moderate to severe spinal canal stenosis at L4-L5. We discussed treatment plan in detail today. Next step is to perform diagnostic and hopefully therapeutic bilateral L4 transforaminal epidural steroid injection under fluoroscopic guidance. If good relief of pain with injection we can repeat this procedure infrequently as needed. I discussed injection procedure with patient in detail today, Dr. Daisey Dryer also at bedside to answer any questions. No red flag symptoms noted upon exam today.     Meds & Orders: No orders of the defined types were placed in this encounter.  No orders of the defined types were placed in this encounter.   Follow-up: Return for Bilateral L4 transforaminal epidural steroid injection.   Procedures: No procedures performed      Clinical History: Narrative & Impression CLINICAL DATA:  back pain, LLE weakness, difficulty having BMs   EXAM: MRI LUMBAR SPINE WITHOUT CONTRAST   TECHNIQUE: Multiplanar, multisequence MR imaging of the lumbar spine was performed. No intravenous contrast was administered.   COMPARISON:  MR L SPine 02/15/15   FINDINGS: Segmentation:  Standard.   Alignment:  Physiologic.   Vertebrae:  No fracture, evidence of discitis, or bone lesion.  Conus medullaris and cauda equina: Conus extends to the L1 level. Conus and cauda equina appear normal.   Paraspinal and other soft tissues: 1.1 cm periaortic lymph node (series 8, image 9).   Disc levels:   T12-L1: No significant disc bulge. Mild bilateral facet degenerative change. No spinal canal narrowing. No neural foraminal narrowing.   L1-L2: No  significant disc bulge. Mild bilateral facet degenerative change. No spinal canal narrowing. No neural foraminal narrowing.   L2-L3: No significant disc bulge. Mild bilateral facet degenerative change with fluid in the facet joints. No spinal canal narrowing. Mild bilateral neural foraminal narrowing.   L3-L4: Circumferential disc bulge. Moderate bilateral facet degenerative change. Moderate spinal canal narrowing. Mild bilateral neural foraminal narrowing.   L4-L5: Eccentric left disc bulge with effacement of the left lateral recess and severe narrowing of the right lateral recess. There is moderate to severe spinal canal narrowing. Moderate bilateral neural foraminal narrowing. This has progressed compared to 2016.   L5-S1: Eccentric left disc bulge with moderate narrowing of the left lateral recess. Moderate bilateral facet degenerative change. Mild spinal canal narrowing. Moderate left and mild right neural foraminal   IMPRESSION: 1. Eccentric left disc bulge at L4-L5 with effacement of the left lateral recess and severe narrowing of the right lateral recess. There is moderate to severe spinal canal narrowing at this level. This has progressed compared to 2016. 2. Eccentric left disc bulge at L5-S1 with moderate narrowing of the left lateral recess, unchanged from prior. 3. Moderate spinal canal narrowing at L3-L4, unchanged from prior.     Electronically Signed   By: Clora Dane M.D.   On: 03/13/2023 14:37   He reports that he has been smoking cigarettes. He has a 20 pack-year smoking history. He has quit using smokeless tobacco. No results for input(s): "HGBA1C", "LABURIC" in the last 8760 hours.  Objective:  VS:  HT:    WT:   BMI:     BP:   HR: bpm  TEMP: ( )  RESP:  Physical Exam Vitals and nursing note reviewed.  HENT:     Head: Normocephalic and atraumatic.     Right Ear: External ear normal.     Left Ear: External ear normal.     Nose: Nose normal.      Mouth/Throat:     Mouth: Mucous membranes are moist.  Eyes:     Extraocular Movements: Extraocular movements intact.  Cardiovascular:     Rate and Rhythm: Normal rate.     Pulses: Normal pulses.  Pulmonary:     Effort: Pulmonary effort is normal.  Abdominal:     General: Abdomen is flat. There is no distension.  Musculoskeletal:        General: Tenderness present.     Cervical back: Normal range of motion.     Comments: Patient rises from seated position to standing without difficulty. Good lumbar range of motion. No pain noted with facet loading. 5/5 strength noted with bilateral hip flexion, knee flexion/extension, ankle dorsiflexion/plantarflexion and EHL. No clonus noted bilaterally. No pain upon palpation of greater trochanters. No pain with internal/external rotation of bilateral hips. Sensation intact bilaterally. Negative slump test bilaterally. Ambulates without aid, gait steady.     Skin:    General: Skin is warm and dry.     Capillary Refill: Capillary refill takes less than 2 seconds.  Neurological:     General: No focal deficit present.     Mental Status: He is alert and oriented to  person, place, and time.  Psychiatric:        Mood and Affect: Mood normal.        Behavior: Behavior normal.     Ortho Exam  Imaging: No results found.  Past Medical/Family/Surgical/Social History: Medications & Allergies reviewed per EMR, new medications updated. Patient Active Problem List   Diagnosis Date Noted   History of back surgery 11/09/2014   Smoking 08/24/2014   Family history of diabetes mellitus (DM) 08/24/2014   Right shoulder pain 08/24/2014   Past Medical History:  Diagnosis Date   Anxiety    never been treated   Arthritis    Constipation    GERD (gastroesophageal reflux disease)    Headache    Family History  Problem Relation Age of Onset   Cancer Father    Stroke Father    Diabetes Maternal Grandmother    Past Surgical History:  Procedure Laterality  Date   APPENDECTOMY     BACK SURGERY     CHOLECYSTECTOMY     SHOULDER ARTHROSCOPY Right 01/01/2015   Procedure: ARTHROSCOPY SHOULDER;  Surgeon: Laddie Ford, MD;  Location: [facility] OR;  Service: Orthopedics;  Laterality: Right;   Social History   Occupational History   Not on file  Tobacco Use   Smoking status: Every Day    Current packs/day: 1.00    Average packs/day: 1 pack/day for 20.0 years (20.0 ttl pk-yrs)    Types: Cigarettes   Smokeless tobacco: Former  Building services engineer status: Never Used  Substance and Sexual Activity   Alcohol use: Yes    Alcohol/week: 0.0 standard drinks of alcohol    Comment: socially    Drug use: Yes    Types: Marijuana, Cocaine   Sexual activity: Yes    Birth control/protection: Condom

## 2024-03-11 ENCOUNTER — Other Ambulatory Visit: Payer: Self-pay | Admitting: Medical Genetics

## 2024-03-14 ENCOUNTER — Encounter: Payer: Self-pay | Admitting: Family

## 2024-03-14 ENCOUNTER — Ambulatory Visit (INDEPENDENT_AMBULATORY_CARE_PROVIDER_SITE_OTHER): Admitting: Family

## 2024-03-14 VITALS — BP 117/82 | HR 83 | Temp 98.3°F | Resp 18 | Ht 73.0 in | Wt 196.8 lb

## 2024-03-14 DIAGNOSIS — Z131 Encounter for screening for diabetes mellitus: Secondary | ICD-10-CM

## 2024-03-14 DIAGNOSIS — Z1159 Encounter for screening for other viral diseases: Secondary | ICD-10-CM

## 2024-03-14 DIAGNOSIS — Z1329 Encounter for screening for other suspected endocrine disorder: Secondary | ICD-10-CM

## 2024-03-14 DIAGNOSIS — Z0000 Encounter for general adult medical examination without abnormal findings: Secondary | ICD-10-CM

## 2024-03-14 DIAGNOSIS — Z1211 Encounter for screening for malignant neoplasm of colon: Secondary | ICD-10-CM

## 2024-03-14 DIAGNOSIS — Z13220 Encounter for screening for lipoid disorders: Secondary | ICD-10-CM

## 2024-03-14 DIAGNOSIS — Z130 Encounter for screening for diseases of the blood and blood-forming organs and certain disorders involving the immune mechanism: Secondary | ICD-10-CM

## 2024-03-14 DIAGNOSIS — Z13228 Encounter for screening for other metabolic disorders: Secondary | ICD-10-CM | POA: Diagnosis not present

## 2024-03-14 DIAGNOSIS — Z114 Encounter for screening for human immunodeficiency virus [HIV]: Secondary | ICD-10-CM

## 2024-03-14 NOTE — Progress Notes (Signed)
 Patient ID: Brett Stokes, male    DOB: 06-Apr-1976  MRN: [id number]  CC: Annual Exam  Subjective: Brett Stokes is a 48 y.o. male who presents for annual exam.  His concerns today include:  - None.  Patient Active Problem List   Diagnosis Date Noted   History of back surgery 11/09/2014   Smoking 08/24/2014   Family history of diabetes mellitus (DM) 08/24/2014   Right shoulder pain 08/24/2014     Current Outpatient Medications on File Prior to Visit  Medication Sig Dispense Refill   Aspirin-Acetaminophen  500-325 MG PACK Take by mouth.     buPROPion  (WELLBUTRIN  SR) 100 MG 12 hr tablet Take 1 tablet (100 mg total) by mouth 2 (two) times daily. 60 tablet 3   cyclobenzaprine  (FLEXERIL ) 10 MG tablet Take 1 tablet (10 mg total) by mouth 2 (two) times daily as needed for muscle spasms. 20 tablet 0   diclofenac  (VOLTAREN ) 75 MG EC tablet Take 1 tablet (75 mg total) by mouth 2 (two) times daily. 15 tablet 0   ibuprofen  (ADVIL ,MOTRIN ) 800 MG tablet Take 1 tablet (800 mg total) by mouth every 8 (eight) hours as needed. Take with food. 60 tablet 0   methocarbamol  (ROBAXIN ) 500 MG tablet Take 1 tablet (500 mg total) by mouth 2 (two) times daily. 20 tablet 0   methocarbamol  (ROBAXIN -750) 750 MG tablet Take 1 tablet (750 mg total) by mouth 4 (four) times daily. 30 tablet 0   methylPREDNISolone  (MEDROL  DOSEPAK) 4 MG TBPK tablet Take as directed on packaging 21 each 0   ondansetron  (ZOFRAN  ODT) 4 MG disintegrating tablet Take 1 tablet (4 mg total) by mouth every 8 (eight) hours as needed for nausea or vomiting. 20 tablet 0   oxyCODONE -acetaminophen  (ROXICET) 5-325 MG per tablet Take 1 tablet by mouth every 4 (four) hours as needed for severe pain. 60 tablet 0   predniSONE  (STERAPRED UNI-PAK 21 TAB) 10 MG (21) TBPK tablet Take by mouth daily. Take 6 tabs by mouth daily  for 2 days, then 5 tabs for 2 days, then 4 tabs for 2 days, then 3 tabs for 2 days, 2 tabs for 2 days, then 1 tab by mouth daily  for 2 days 42 tablet 0   gabapentin  (NEURONTIN ) 300 MG capsule Take 1 capsule (300 mg total) by mouth at bedtime. (Patient not taking: Reported on 03/14/2024) 90 capsule 0   No current facility-administered medications on file prior to visit.    No Known Allergies  Social History   Socioeconomic History   Marital status: Divorced    Spouse name: Not on file   Number of children: Not on file   Years of education: Not on file   Highest education level: GED or equivalent  Occupational History   Not on file  Tobacco Use   Smoking status: Every Day    Current packs/day: 1.00    Average packs/day: 1 pack/day for 20.0 years (20.0 ttl pk-yrs)    Types: Cigarettes   Smokeless tobacco: Former  Building services engineer status: Never Used  Substance and Sexual Activity   Alcohol use: Yes    Alcohol/week: 0.0 standard drinks of alcohol    Comment: socially    Drug use: Yes    Types: Marijuana, Cocaine   Sexual activity: Yes    Birth control/protection: Condom  Other Topics Concern   Not on file  Social History Narrative   Not on file   Social Drivers of Health  Financial Resource Strain: High Risk (03/13/2024)   Overall Financial Resource Strain (CARDIA)    Difficulty of Paying Living Expenses: Very hard  Food Insecurity: Food Insecurity Present (03/13/2024)   Hunger Vital Sign    Worried About Running Out of Food in the Last Year: Often true    Ran Out of Food in the Last Year: Often true  Transportation Needs: Unmet Transportation Needs (03/13/2024)   PRAPARE - Transportation    Lack of Transportation (Medical): Yes    Lack of Transportation (Non-Medical): Yes  Physical Activity: Inactive (03/13/2024)   Exercise Vital Sign    Days of Exercise per Week: 0 days    Minutes of Exercise per Session: Not on file  Stress: Stress Concern Present (03/13/2024)   [employer] of Occupational Health - Occupational Stress Questionnaire    Feeling of Stress: Rather much  Social  Connections: Socially Isolated (03/13/2024)   Social Connection and Isolation Panel    Frequency of Communication with Friends and Family: Never    Frequency of Social Gatherings with Friends and Family: Never    Attends Religious Services: Never    Database administrator or Organizations: No    Attends Engineer, structural: Not on file    Marital Status: Divorced  Intimate Partner Violence: Not At Risk (02/12/2024)   Humiliation, Afraid, Rape, and Kick questionnaire    Fear of Current or Ex-Partner: No    Emotionally Abused: No    Physically Abused: No    Sexually Abused: No    Family History  Problem Relation Age of Onset   Cancer Father    Stroke Father    Diabetes Maternal Grandmother     Past Surgical History:  Procedure Laterality Date   APPENDECTOMY     BACK SURGERY     CHOLECYSTECTOMY     SHOULDER ARTHROSCOPY Right 01/01/2015   Procedure: ARTHROSCOPY SHOULDER;  Surgeon: Madex Ford, MD;  Location: [facility] OR;  Service: Orthopedics;  Laterality: Right;    ROS: Review of Systems Negative except as stated above  PHYSICAL EXAM: BP 117/82   Pulse 83   Temp 98.3 F (36.8 C) (Oral)   Resp 18   Ht 6' 1 (1.854 m)   Wt 196 lb 12.8 oz (89.3 kg)   SpO2 93%   BMI 25.96 kg/m   Physical Exam HENT:     Head: Normocephalic and atraumatic.     Right Ear: Tympanic membrane, ear canal and external ear normal.     Left Ear: Tympanic membrane, ear canal and external ear normal.     Nose: Nose normal.     Mouth/Throat:     Mouth: Mucous membranes are moist.     Pharynx: Oropharynx is clear.   Eyes:     Extraocular Movements: Extraocular movements intact.     Conjunctiva/sclera: Conjunctivae normal.     Pupils: Pupils are equal, round, and reactive to light.   Neck:     Thyroid: No thyroid mass, thyromegaly or thyroid tenderness.   Cardiovascular:     Rate and Rhythm: Normal rate and regular rhythm.     Pulses: Normal pulses.     Heart sounds: Normal heart  sounds.  Pulmonary:     Effort: Pulmonary effort is normal.     Breath sounds: Normal breath sounds.  Abdominal:     General: Bowel sounds are normal.     Palpations: Abdomen is soft.  Genitourinary:    Comments: Patient declined.  Musculoskeletal:  General: Normal range of motion.     Right shoulder: Normal.     Left shoulder: Normal.     Right upper arm: Normal.     Left upper arm: Normal.     Right elbow: Normal.     Left elbow: Normal.     Right forearm: Normal.     Left forearm: Normal.     Right wrist: Normal.     Left wrist: Normal.     Right hand: Normal.     Left hand: Normal.     Cervical back: Normal, normal range of motion and neck supple.     Thoracic back: Normal.     Lumbar back: Normal.     Right hip: Normal.     Left hip: Normal.     Right upper leg: Normal.     Left upper leg: Normal.     Right knee: Normal.     Left knee: Normal.     Right lower leg: Normal.     Left lower leg: Normal.     Right ankle: Normal.     Left ankle: Normal.     Right foot: Normal.     Left foot: Normal.   Skin:    General: Skin is warm and dry.     Capillary Refill: Capillary refill takes less than 2 seconds.   Neurological:     General: No focal deficit present.     Mental Status: He is alert and oriented to person, place, and time.   Psychiatric:        Mood and Affect: Mood normal.        Behavior: Behavior normal.     ASSESSMENT AND PLAN: 1. Annual physical exam (Primary) - Counseled on 150 minutes of exercise per week as tolerated, healthy eating (including decreased daily intake of saturated fats, cholesterol, added sugars, sodium), STI prevention, and routine healthcare maintenance.  2. Screening for metabolic disorder - Routine screening.  - CMP14+EGFR  3. Screening for deficiency anemia - Routine screening.  - CBC  4. Diabetes mellitus screening - Routine screening.  - Hemoglobin A1c  5. Screening cholesterol level - Routine screening.   - Lipid panel  6. Thyroid disorder screen - Routine screening.  - TSH  7. Encounter for screening for HIV - Routine screening.  - HIV antibody (with reflex)  8. Need for hepatitis C screening test - Routine screening.  - Hepatitis C Antibody  9. Colon cancer screening - Referral to Gastroenterology for colon cancer screening by colonoscopy. - Ambulatory referral to Gastroenterology   Patient was given the opportunity to ask questions.  Patient verbalized understanding of the plan and was able to repeat key elements of the plan. Patient was given clear instructions to go to Emergency Department or return to medical center if symptoms don't improve, worsen, or new problems develop.The patient verbalized understanding.   Orders Placed This Encounter  Procedures   CBC   Lipid panel   CMP14+EGFR   Hemoglobin A1c   TSH   HIV antibody (with reflex)   Hepatitis C Antibody   Ambulatory referral to Gastroenterology    Return in about 1 year (around 03/14/2025) for Physical per patient preference.  Senaida Dama, NP

## 2024-03-14 NOTE — Progress Notes (Signed)
 No concerns.

## 2024-03-15 LAB — LIPID PANEL
Chol/HDL Ratio: 4.5 ratio (ref 0.0–5.0)
Cholesterol, Total: 176 mg/dL (ref 100–199)
HDL: 39 mg/dL — ABNORMAL LOW (ref >39)
LDL Chol Calc (NIH): 124 mg/dL — ABNORMAL HIGH (ref 0–99)
Triglycerides: 69 mg/dL (ref 0–149)
VLDL Cholesterol Cal: 13 mg/dL (ref 5–40)

## 2024-03-15 LAB — CMP14+EGFR
ALT: 11 IU/L (ref 0–44)
AST: 17 IU/L (ref 0–40)
Albumin: 4.4 g/dL (ref 4.1–5.1)
Alkaline Phosphatase: 64 IU/L (ref 44–121)
BUN/Creatinine Ratio: 14 (ref 9–20)
BUN: 11 mg/dL (ref 6–24)
Bilirubin Total: 0.3 mg/dL (ref 0.0–1.2)
CO2: 20 mmol/L (ref 20–29)
Calcium: 9.4 mg/dL (ref 8.7–10.2)
Chloride: 106 mmol/L (ref 96–106)
Creatinine, Ser: 0.77 mg/dL (ref 0.76–1.27)
Globulin, Total: 2.1 g/dL (ref 1.5–4.5)
Glucose: 93 mg/dL (ref 70–99)
Potassium: 4.4 mmol/L (ref 3.5–5.2)
Sodium: 141 mmol/L (ref 134–144)
Total Protein: 6.5 g/dL (ref 6.0–8.5)
eGFR: 112 mL/min/{1.73_m2} (ref >59)

## 2024-03-15 LAB — CBC
Hematocrit: 47.6 % (ref 37.5–51.0)
Hemoglobin: 16 g/dL (ref 13.0–17.7)
MCH: 33.7 pg — ABNORMAL HIGH (ref 26.6–33.0)
MCHC: 33.6 g/dL (ref 31.5–35.7)
MCV: 100 fL — ABNORMAL HIGH (ref 79–97)
Platelets: 264 10*3/uL (ref 150–450)
RBC: 4.75 x10E6/uL (ref 4.14–5.80)
RDW: 12.8 % (ref 11.6–15.4)
WBC: 6.2 10*3/uL (ref 3.4–10.8)

## 2024-03-15 LAB — HIV ANTIBODY (ROUTINE TESTING W REFLEX): HIV Screen 4th Generation wRfx: NONREACTIVE

## 2024-03-15 LAB — HEMOGLOBIN A1C
Est. average glucose Bld gHb Est-mCnc: 114 mg/dL
Hgb A1c MFr Bld: 5.6 % (ref 4.8–5.6)

## 2024-03-15 LAB — HEPATITIS C ANTIBODY: Hep C Virus Ab: NONREACTIVE

## 2024-03-15 LAB — TSH: TSH: 2.11 u[IU]/mL (ref 0.450–4.500)

## 2024-03-17 ENCOUNTER — Ambulatory Visit: Payer: Self-pay | Admitting: Family

## 2024-03-17 DIAGNOSIS — Z13220 Encounter for screening for lipoid disorders: Secondary | ICD-10-CM

## 2024-03-20 ENCOUNTER — Other Ambulatory Visit

## 2024-03-20 DIAGNOSIS — Z006 Encounter for examination for normal comparison and control in clinical research program: Secondary | ICD-10-CM

## 2024-03-28 LAB — GENECONNECT MOLECULAR SCREEN: Genetic Analysis Overall Interpretation: NEGATIVE

## 2024-04-16 ENCOUNTER — Other Ambulatory Visit: Payer: Self-pay

## 2024-04-16 ENCOUNTER — Ambulatory Visit: Admitting: Physical Medicine and Rehabilitation

## 2024-04-16 VITALS — BP 113/74 | HR 73

## 2024-04-16 DIAGNOSIS — M5416 Radiculopathy, lumbar region: Secondary | ICD-10-CM

## 2024-04-16 MED ORDER — METHYLPREDNISOLONE ACETATE 40 MG/ML IJ SUSP
40.0000 mg | Freq: Once | INTRAMUSCULAR | Status: AC
Start: 2024-04-16 — End: 2024-04-16
  Administered 2024-04-16: 40 mg

## 2024-04-16 NOTE — Progress Notes (Signed)
 Pain Scale   Average Pain 8 Patient advising he has lower back pain that is constant without relief.         +Driver, -BT, -Dye Allergies.

## 2024-04-16 NOTE — Progress Notes (Signed)
 Brett Stokes - 48 y.o. male MRN [id number]  Date of birth: [birth year]  Office Visit Note: Visit Date: 04/16/2024 PCP: Lorren Greig PARAS, NP Referred by: Lorren Greig PARAS, NP  Subjective: Chief Complaint  Patient presents with   Lower Back - Pain   HPI:  Brett Stokes is a 48 y.o. male who comes in today at the request of Duwaine Pouch, FNP for planned Bilateral L4-5 Lumbar Transforaminal epidural steroid injection with fluoroscopic guidance.  The patient has failed conservative care including home exercise, medications, time and activity modification.  This injection will be diagnostic and hopefully therapeutic.  Please see requesting physician notes for further details and justification.    ROS Otherwise per HPI.  Assessment & Plan: Visit Diagnoses:    ICD-10-CM   1. Lumbar radiculopathy  M54.16 XR C-ARM NO REPORT    Epidural Steroid injection    methylPREDNISolone  acetate (DEPO-MEDROL ) injection 40 mg      Plan: No additional findings.   Meds & Orders:  Meds ordered this encounter  Medications   methylPREDNISolone  acetate (DEPO-MEDROL ) injection 40 mg    Orders Placed This Encounter  Procedures   XR C-ARM NO REPORT   Epidural Steroid injection    Follow-up: Return for visit to requesting provider as needed.   Procedures: No procedures performed  Lumbosacral Transforaminal Epidural Steroid Injection - Sub-Pedicular Approach with Fluoroscopic Guidance  Patient: Brett Stokes      Date of Birth: May 06, 1976 MRN: [id number] PCP: Lorren Greig PARAS, NP      Visit Date: 04/16/2024   Universal Protocol:    Date/Time: 04/16/2024  Consent Given By: the patient  Position: PRONE  Additional Comments: Vital signs were monitored before and after the procedure. Patient was prepped and draped in the usual sterile fashion. The correct patient, procedure, and site was verified.   Injection Procedure Details:   Procedure diagnoses: Lumbar radiculopathy [M54.16]     Meds Administered:  Meds ordered this encounter  Medications   methylPREDNISolone  acetate (DEPO-MEDROL ) injection 40 mg    Laterality: Bilateral  Location/Site: L4  Needle:5.0 in., 22 ga.  Short bevel or Quincke spinal needle  Needle Placement: Transforaminal  Findings:    -Comments: Excellent flow of contrast along the nerve, nerve root and into the epidural space.  Procedure Details: After squaring off the end-plates to get a true AP view, the C-arm was positioned so that an oblique view of the foramen as noted above was visualized. The target area is just inferior to the nose of the scotty dog or sub pedicular. The soft tissues overlying this structure were infiltrated with 2-3 ml. of 1% Lidocaine  without Epinephrine.  The spinal needle was inserted toward the target using a trajectory view along the fluoroscope beam.  Under AP and lateral visualization, the needle was advanced so it did not puncture dura and was located close the 6 O'Clock position of the pedical in AP tracterory. Biplanar projections were used to confirm position. Aspiration was confirmed to be negative for CSF and/or blood. A 1-2 ml. volume of Isovue-250 was injected and flow of contrast was noted at each level. Radiographs were obtained for documentation purposes.   After attaining the desired flow of contrast documented above, a 0.5 to 1.0 ml test dose of 0.25% Marcaine  was injected into each respective transforaminal space.  The patient was observed for 90 seconds post injection.  After no sensory deficits were reported, and normal lower extremity motor function was noted,   the above  injectate was administered so that equal amounts of the injectate were placed at each foramen (level) into the transforaminal epidural space.   Additional Comments:  The patient tolerated the procedure well Dressing: 2 x 2 sterile gauze and Band-Aid    Post-procedure details: Patient was observed during the  procedure. Post-procedure instructions were reviewed.  Patient left the clinic in stable condition.    Clinical History: Narrative & Impression CLINICAL DATA:  back pain, LLE weakness, difficulty having BMs   EXAM: MRI LUMBAR SPINE WITHOUT CONTRAST   TECHNIQUE: Multiplanar, multisequence MR imaging of the lumbar spine was performed. No intravenous contrast was administered.   COMPARISON:  MR L SPine 02/15/15   FINDINGS: Segmentation:  Standard.   Alignment:  Physiologic.   Vertebrae:  No fracture, evidence of discitis, or bone lesion.   Conus medullaris and cauda equina: Conus extends to the L1 level. Conus and cauda equina appear normal.   Paraspinal and other soft tissues: 1.1 cm periaortic lymph node (series 8, image 9).   Disc levels:   T12-L1: No significant disc bulge. Mild bilateral facet degenerative change. No spinal canal narrowing. No neural foraminal narrowing.   L1-L2: No significant disc bulge. Mild bilateral facet degenerative change. No spinal canal narrowing. No neural foraminal narrowing.   L2-L3: No significant disc bulge. Mild bilateral facet degenerative change with fluid in the facet joints. No spinal canal narrowing. Mild bilateral neural foraminal narrowing.   L3-L4: Circumferential disc bulge. Moderate bilateral facet degenerative change. Moderate spinal canal narrowing. Mild bilateral neural foraminal narrowing.   L4-L5: Eccentric left disc bulge with effacement of the left lateral recess and severe narrowing of the right lateral recess. There is moderate to severe spinal canal narrowing. Moderate bilateral neural foraminal narrowing. This has progressed compared to 2016.   L5-S1: Eccentric left disc bulge with moderate narrowing of the left lateral recess. Moderate bilateral facet degenerative change. Mild spinal canal narrowing. Moderate left and mild right neural foraminal   IMPRESSION: 1. Eccentric left disc bulge at L4-L5 with  effacement of the left lateral recess and severe narrowing of the right lateral recess. There is moderate to severe spinal canal narrowing at this level. This has progressed compared to 2016. 2. Eccentric left disc bulge at L5-S1 with moderate narrowing of the left lateral recess, unchanged from prior. 3. Moderate spinal canal narrowing at L3-L4, unchanged from prior.     Electronically Signed   By: Lyndall Gore M.D.   On: 03/13/2023 14:37     Objective:  VS:  HT:    WT:   BMI:     BP:113/74  HR:73bpm  TEMP: ( )  RESP:  Physical Exam Vitals and nursing note reviewed.  Constitutional:      General: He is not in acute distress.    Appearance: Normal appearance. He is not ill-appearing.  HENT:     Head: Normocephalic and atraumatic.     Right Ear: External ear normal.     Left Ear: External ear normal.     Nose: No congestion.  Eyes:     Extraocular Movements: Extraocular movements intact.  Cardiovascular:     Rate and Rhythm: Normal rate.     Pulses: Normal pulses.  Pulmonary:     Effort: Pulmonary effort is normal. No respiratory distress.  Abdominal:     General: There is no distension.     Palpations: Abdomen is soft.  Musculoskeletal:        General: No tenderness or signs of injury.  Cervical back: Neck supple.     Right lower leg: No edema.     Left lower leg: No edema.     Comments: Patient has good distal strength without clonus.  Skin:    Findings: No erythema or rash.  Neurological:     General: No focal deficit present.     Mental Status: He is alert and oriented to person, place, and time.     Sensory: No sensory deficit.     Motor: No weakness or abnormal muscle tone.     Coordination: Coordination normal.  Psychiatric:        Mood and Affect: Mood normal.        Behavior: Behavior normal.      Imaging: No results found.

## 2024-04-16 NOTE — Procedures (Signed)
 Lumbosacral Transforaminal Epidural Steroid Injection - Sub-Pedicular Approach with Fluoroscopic Guidance  Patient: Brett Stokes      Date of Birth: 12/04/1975 MRN: [id number] PCP: Lorren Greig PARAS, NP      Visit Date: 04/16/2024   Universal Protocol:    Date/Time: 04/16/2024  Consent Given By: the patient  Position: PRONE  Additional Comments: Vital signs were monitored before and after the procedure. Patient was prepped and draped in the usual sterile fashion. The correct patient, procedure, and site was verified.   Injection Procedure Details:   Procedure diagnoses: Lumbar radiculopathy [M54.16]    Meds Administered:  Meds ordered this encounter  Medications   methylPREDNISolone  acetate (DEPO-MEDROL ) injection 40 mg    Laterality: Bilateral  Location/Site: L4  Needle:5.0 in., 22 ga.  Short bevel or Quincke spinal needle  Needle Placement: Transforaminal  Findings:    -Comments: Excellent flow of contrast along the nerve, nerve root and into the epidural space.  Procedure Details: After squaring off the end-plates to get a true AP view, the C-arm was positioned so that an oblique view of the foramen as noted above was visualized. The target area is just inferior to the nose of the scotty dog or sub pedicular. The soft tissues overlying this structure were infiltrated with 2-3 ml. of 1% Lidocaine  without Epinephrine.  The spinal needle was inserted toward the target using a trajectory view along the fluoroscope beam.  Under AP and lateral visualization, the needle was advanced so it did not puncture dura and was located close the 6 O'Clock position of the pedical in AP tracterory. Biplanar projections were used to confirm position. Aspiration was confirmed to be negative for CSF and/or blood. A 1-2 ml. volume of Isovue-250 was injected and flow of contrast was noted at each level. Radiographs were obtained for documentation purposes.   After attaining the desired  flow of contrast documented above, a 0.5 to 1.0 ml test dose of 0.25% Marcaine  was injected into each respective transforaminal space.  The patient was observed for 90 seconds post injection.  After no sensory deficits were reported, and normal lower extremity motor function was noted,   the above injectate was administered so that equal amounts of the injectate were placed at each foramen (level) into the transforaminal epidural space.   Additional Comments:  The patient tolerated the procedure well Dressing: 2 x 2 sterile gauze and Band-Aid    Post-procedure details: Patient was observed during the procedure. Post-procedure instructions were reviewed.  Patient left the clinic in stable condition.

## 2024-04-16 NOTE — Patient Instructions (Signed)

## 2024-08-04 ENCOUNTER — Encounter: Payer: Self-pay | Admitting: Radiology

## 2025-03-16 ENCOUNTER — Encounter: Admitting: Family
